# Patient Record
Sex: Female | Born: 1975 | Race: White | Hispanic: No | Marital: Married | Smoking: Never smoker
Health system: Southern US, Community
[De-identification: ages and names within clinical notes are randomized; demographics above are authoritative.]

## PROBLEM LIST (undated history)

## (undated) DIAGNOSIS — B019 Varicella without complication: Secondary | ICD-10-CM

## (undated) DIAGNOSIS — Z803 Family history of malignant neoplasm of breast: Secondary | ICD-10-CM

## (undated) DIAGNOSIS — Z8041 Family history of malignant neoplasm of ovary: Secondary | ICD-10-CM

## (undated) DIAGNOSIS — E559 Vitamin D deficiency, unspecified: Secondary | ICD-10-CM

## (undated) DIAGNOSIS — Z1379 Encounter for other screening for genetic and chromosomal anomalies: Principal | ICD-10-CM

## (undated) HISTORY — DX: Family history of malignant neoplasm of breast: Z80.3

## (undated) HISTORY — DX: Vitamin D deficiency, unspecified: E55.9

## (undated) HISTORY — DX: Encounter for other screening for genetic and chromosomal anomalies: Z13.79

## (undated) HISTORY — DX: Varicella without complication: B01.9

## (undated) HISTORY — DX: Family history of malignant neoplasm of ovary: Z80.41

---

## 2000-12-07 HISTORY — PX: DILATION AND CURETTAGE OF UTERUS: SHX78

## 2000-12-07 HISTORY — PX: WISDOM TOOTH EXTRACTION: SHX21

## 2001-11-15 ENCOUNTER — Encounter (INDEPENDENT_AMBULATORY_CARE_PROVIDER_SITE_OTHER): Payer: Self-pay | Admitting: Specialist

## 2001-11-15 ENCOUNTER — Ambulatory Visit (HOSPITAL_COMMUNITY): Admission: RE | Admit: 2001-11-15 | Discharge: 2001-11-15 | Payer: Self-pay | Admitting: Obstetrics and Gynecology

## 2002-05-08 ENCOUNTER — Other Ambulatory Visit: Admission: RE | Admit: 2002-05-08 | Discharge: 2002-05-08 | Payer: Self-pay | Admitting: Obstetrics and Gynecology

## 2002-05-14 ENCOUNTER — Encounter: Payer: Self-pay | Admitting: *Deleted

## 2002-05-14 ENCOUNTER — Ambulatory Visit (HOSPITAL_COMMUNITY): Admission: RE | Admit: 2002-05-14 | Discharge: 2002-05-14 | Payer: Self-pay | Admitting: *Deleted

## 2002-12-19 ENCOUNTER — Inpatient Hospital Stay (HOSPITAL_COMMUNITY): Admission: AD | Admit: 2002-12-19 | Discharge: 2002-12-23 | Payer: Self-pay | Admitting: Obstetrics and Gynecology

## 2002-12-19 ENCOUNTER — Encounter (INDEPENDENT_AMBULATORY_CARE_PROVIDER_SITE_OTHER): Payer: Self-pay | Admitting: Specialist

## 2003-01-18 ENCOUNTER — Other Ambulatory Visit: Admission: RE | Admit: 2003-01-18 | Discharge: 2003-01-18 | Payer: Self-pay | Admitting: Obstetrics and Gynecology

## 2004-01-23 ENCOUNTER — Other Ambulatory Visit: Admission: RE | Admit: 2004-01-23 | Discharge: 2004-01-23 | Payer: Self-pay | Admitting: Obstetrics and Gynecology

## 2005-04-24 ENCOUNTER — Inpatient Hospital Stay (HOSPITAL_COMMUNITY): Admission: AD | Admit: 2005-04-24 | Discharge: 2005-04-24 | Payer: Self-pay | Admitting: Obstetrics and Gynecology

## 2005-08-19 ENCOUNTER — Inpatient Hospital Stay (HOSPITAL_COMMUNITY): Admission: RE | Admit: 2005-08-19 | Discharge: 2005-08-22 | Payer: Self-pay | Admitting: Obstetrics and Gynecology

## 2009-12-07 HISTORY — PX: GALLBLADDER SURGERY: SHX652

## 2009-12-07 HISTORY — PX: CHOLECYSTECTOMY: SHX55

## 2010-10-11 ENCOUNTER — Emergency Department (HOSPITAL_BASED_OUTPATIENT_CLINIC_OR_DEPARTMENT_OTHER)
Admission: EM | Admit: 2010-10-11 | Discharge: 2010-10-11 | Payer: Self-pay | Source: Home / Self Care | Admitting: Emergency Medicine

## 2010-10-12 ENCOUNTER — Ambulatory Visit: Payer: Self-pay | Admitting: Interventional Radiology

## 2011-01-06 ENCOUNTER — Encounter
Admission: RE | Admit: 2011-01-06 | Discharge: 2011-01-06 | Payer: Self-pay | Source: Home / Self Care | Attending: Obstetrics and Gynecology | Admitting: Obstetrics and Gynecology

## 2011-01-07 ENCOUNTER — Other Ambulatory Visit: Payer: Self-pay | Admitting: Obstetrics and Gynecology

## 2011-01-07 DIAGNOSIS — R928 Other abnormal and inconclusive findings on diagnostic imaging of breast: Secondary | ICD-10-CM

## 2011-01-13 ENCOUNTER — Ambulatory Visit
Admission: RE | Admit: 2011-01-13 | Discharge: 2011-01-13 | Disposition: A | Discharge status: Home / Self Care | Payer: BC Managed Care – PPO | Source: Ambulatory Visit | Attending: Obstetrics and Gynecology | Admitting: Obstetrics and Gynecology

## 2011-01-13 ENCOUNTER — Other Ambulatory Visit: Payer: Self-pay

## 2011-01-13 DIAGNOSIS — R928 Other abnormal and inconclusive findings on diagnostic imaging of breast: Secondary | ICD-10-CM

## 2011-02-17 LAB — URINALYSIS, ROUTINE W REFLEX MICROSCOPIC
Nitrite: POSITIVE — AB
Specific Gravity, Urine: 1.021 (ref 1.005–1.030)
pH: 5.5 (ref 5.0–8.0)

## 2011-02-17 LAB — COMPREHENSIVE METABOLIC PANEL
ALT: 21 U/L (ref 0–35)
AST: 20 U/L (ref 0–37)
Albumin: 4.6 g/dL (ref 3.5–5.2)
CO2: 26 mEq/L (ref 19–32)
Calcium: 9.6 mg/dL (ref 8.4–10.5)
Chloride: 104 mEq/L (ref 96–112)
GFR calc Af Amer: >60 mL/min (ref >60)
GFR calc non Af Amer: >60 mL/min (ref >60)
Sodium: 143 mEq/L (ref 135–145)
Total Bilirubin: 0.5 mg/dL (ref 0.3–1.2)

## 2011-02-17 LAB — URINE CULTURE

## 2011-02-17 LAB — URINE MICROSCOPIC-ADD ON

## 2011-02-17 LAB — DIFFERENTIAL
Eosinophils Absolute: 0 10*3/uL (ref 0.0–0.7)
Eosinophils Relative: 0 % (ref 0–5)
Lymphocytes Relative: 10 % — ABNORMAL LOW (ref 12–46)
Lymphs Abs: 1.6 10*3/uL (ref 0.7–4.0)
Monocytes Absolute: 0.4 10*3/uL (ref 0.1–1.0)

## 2011-02-17 LAB — PREGNANCY, URINE: Preg Test, Ur: NEGATIVE

## 2011-02-17 LAB — CBC
Hemoglobin: 13.7 g/dL (ref 12.0–15.0)
Platelets: 372 10*3/uL (ref 150–400)
RBC: 4.31 MIL/uL (ref 3.87–5.11)

## 2011-02-17 LAB — LIPASE, BLOOD: Lipase: 60 U/L (ref 23–300)

## 2011-04-24 NOTE — Discharge Summary (Signed)
   NAMEBERNARDINA, Kayla [name]                          ACCOUNT NO.:  [id number]   MEDICAL RECORD NO.:  [id number]                   PATIENT TYPE:  INP   LOCATION:  9146                                 FACILITY:  [facility]   PHYSICIAN:  Lenoard Aden, M.D.             DATE OF BIRTH:  09-28-1976   DATE OF ADMISSION:  12/19/2002  DATE OF DISCHARGE:  12/23/2002                                 DISCHARGE SUMMARY   HISTORY OF PRESENT ILLNESS:  The patient underwent uncomplicated primary  cesarean section December 19, 2002 without complications.  Postoperative  course uncomplicated.  Discharged to home postoperative day number four.  Given Tylox, Motrin, prenatal vitamins for discharge.  Discharge teaching  done.  Follow up in the office in four weeks.  Incisional care discussed.                                               Lenoard Aden, M.D.    RJT/MEDQ  D:  02/20/2003  T:  02/21/2003  Job:  [id number]

## 2011-04-24 NOTE — H&P (Signed)
   NAMELAVEYAH, Kayla [name]                          ACCOUNT NO.:  [id number]   MEDICAL RECORD NO.:  [id number]                   PATIENT TYPE:  INP   LOCATION:  9170                                 [facility]ILITY[facility]   PHYSICIAN:  Lenoard Aden, M.D.             DATE OF BIRTH:  Oct 23, 1976   DATE OF ADMISSION:  12/19/2002  DATE OF DISCHARGE:                                HISTORY & PHYSICAL   CHIEF COMPLAINT:  Presumed large for gestational age with single umbilical  artery.   HISTORY OF PRESENT ILLNESS:  A 35 year old white female, G2, P0, EDD of  December 23, 2002, at 39+ weeks with an estimated fetal weight of greater  than 9 pounds and a single umbilical artery, who presents for induction.   PAST MEDICAL HISTORY:  Remarkable for spontaneous pregnancy loss with D&E in  2002.   ALLERGIES:  No known drug allergies.   MEDICATIONS:  Prenatal vitamins.   PAST SURGICAL HISTORY:  1. Wisdom teeth removal.  2. D&E as noted.   FAMILY HISTORY:  Breast cancer, insulin-dependent diabetes, hypertension,  and myocardial infarction.   PRENATAL LABORATORY DATA:  Blood type O+.  Rubella immune.  Hepatitis B  surface antigen negative.  HIV nonreactive.  RPR negative.  GC and chlamydia  negative.  Pregnancy complicated by a normal ultrasound survey with single  umbilical artery and interval growth greater than the 90th percentile.   PHYSICAL EXAMINATION:  GENERAL APPEARANCE:  She is a well-developed, well-  nourished, white female in no apparent distress.  HEENT:  Normal.  LUNGS:  Clear.  HEART:  Regular rhythm.  ABDOMEN:  Soft, gravid, and nontender.  Estimated fetal weight 9 pounds.  PELVIC:  The cervix is 3-4 cm, 50% vertex, and -2.  EXTREMITIES:  No cords.  NEUROLOGIC:  Nonfocal.   IMPRESSION:  1. 39-week pregnancy.  2. Single umbilical artery.  3. Presumed large for gestational age.    PLAN:  Proceed with induction.  The risks of expectant management versus  induction were  discussed.  Placenta to pathology.                                               Lenoard Aden, M.D.    RJT/MEDQ  D:  12/19/2002  T:  12/19/2002  Job:  [id number]   cc:   Ma Hillock OB/BYN

## 2011-04-24 NOTE — H&P (Signed)
Kayla [name], Kayla [name]                ACCOUNT NO.:  [id number]   MEDICAL RECORD NO.:  [id number]          PATIENT TYPE:  INP   LOCATION:  NA                            [facility]   PHYSICIAN:  Lenoard Aden, M.D.DATE OF BIRTH:  27-Jun-1976   DATE OF ADMISSION:  08/19/2005  DATE OF DISCHARGE:                                HISTORY & PHYSICAL   CHIEF COMPLAINT:  Elective repeat C-section.   HISTORY OF PRESENT ILLNESS:  The patient is a 35 year old white female, G3,  P1-0-1-1, EDD August 29, 2005, at 62 weeks' gestation with estimated  fetal weight of greater than 9 pounds for a repeat C-section.  The patient  declines VBAC.   PAST MEDICAL HISTORY:  1.  C-section in 2004.  2.  D&E 2002.   No known drug allergies.   MEDICATIONS:  Prenatal vitamins.   PAST SURGICAL HISTORY:  1.  C-section.  2.  Wisdom teeth removal.  3.  D&E as noted.   FAMILY HISTORY:  Breast cancer, insulin-dependent diabetes, hypertension,  myocardial infarction.   PRENATAL LAB DATA:  Blood type O positive.  Rubella immune.  Hepatitis  surface antigen negative.  HIV nonreactive.  RPR negative.  GC Chlamydia  negative.  Pregnancy otherwise uncomplicated.   PHYSICAL EXAMINATION:  GENERAL:  She is a well-developed, well-nourished,  white female in no acute distress.  HEENT:  Normal.  LUNGS:  Clear.  HEART:  Regular rhythm.  ABDOMEN:  Soft, gravid, nontender.  Estimated fetal weight 9 to 9 and 1/2  pounds.  PELVIC:  Cervix is closed, 3-cm long, vertex, -2.  EXTREMITIES:  Reveal no cords.  NEUROLOGIC:  Nonfocal.   IMPRESSION:  1.  A 39 week intrauterine pregnancy.  2.  Previous cesarean section for repeat.   PLAN:  Proceed with repeat low segment transverse cesarean section.  Risks  of anesthesia, infection, bleeding, intraabdominal rupture and need for  repair is discussed __________  complications to include bowel and bladder  injury were noted with possible need for repair.  The patient  acknowledges  and wishes to proceed.      Lenoard Aden, M.D.  Electronically Signed     RJT/MEDQ  D:  08/18/2005  T:  08/18/2005  Job:  [id number]   cc:   Ma Hillock

## 2011-04-24 NOTE — Op Note (Signed)
[facility]  Patient:    Kayla [name], Kayla [name] Visit Number: [id number] MRN: [id number]          Service Type: DSU Location: [facility] Attending Physician:  Lenoard Aden Dictated by:   Lenoard Aden, M.D. Proc. Date: 11/15/01 Admit Date:  11/15/2001                             Operative Report  PREOPERATIVE DIAGNOSES:       Missed abortion at 9 weeks.  POSTOPERATIVE DIAGNOSES:      Missed abortion at 9 weeks.  PROCEDURE:                    Suction dilatation and evacuation.  SURGEON:                      Lenoard Aden, M.D.  ANESTHESIA:                   MAC, paracervical.  ESTIMATED BLOOD LOSS:         Less than 50 cc.  COMPLICATIONS:                None.  DRAINS:                       None.  COUNTS:                       Correct.  DISPOSITION:                  Patient to recovery in good condition.  PROCEDURE:                    After being apprised of the risks of anesthesia, infection, bleeding, uterine perforation with need for repair, patient was brought to the operating room where she was administered IV sedation without difficulty, prepped and draped in a usual sterile fashion.  Examination under anesthesia reveals an 8 week anteflexed uterus.  No adnexal masses at this time.  Uterus is dilated easily up to a #25 Pratt dilator after placement of a paracervical block with a dilute 2% Xylocaine solution 25 cc.  Cervix is grasped using a single tooth tenaculum and sounds to 12 cm.  Dilated easily as mentioned up to a 25 Pratt dilator and 8 mm curved suction curette placed. Suction revealing products of conception.  Repeat suction and curettage in a four quadrant method reveals the cavity to be empty with good hemostasis achieved.  All instruments are removed.  Examination under anesthesia reveals a small anteflexed uterus.  No adnexal masses.  Patient is awakened and transferred to recovery in good condition. Dictated by:    Lenoard Aden, M.D. Attending Physician:  Lenoard Aden DD:  11/15/01 TD:  11/15/01 Job: [id number]

## 2011-04-24 NOTE — Op Note (Signed)
[name], [name]                ACCOUNT NO.:  [id number]   MEDICAL RECORD NO.:  [id number]          PATIENT TYPE:  INP   LOCATION:  9199                          [facility]ILITY:  [facility]   PHYSICIAN:  Lenoard Aden, M.D.DATE OF BIRTH:  10-24-1976   DATE OF PROCEDURE:  08/19/2005  DATE OF DISCHARGE:                                 OPERATIVE REPORT   PREOPERATIVE DIAGNOSIS:  Thirty-nine week intrauterine pregnancy, for  elective repeat cesarean section.   POSTOPERATIVE DIAGNOSIS:  Thirty-nine week intrauterine pregnancy, for  elective repeat cesarean section.   PROCEDURE:  Repeat low segment transverse cesarean section.   SURGEON:  Lenoard Aden, M.D.   ASSISTANT:  Richardean Sale, M.D.   ESTIMATED BLOOD LOSS:  1000 mL.   COMPLICATIONS:  None.   DRAINS:  Foley.   COUNTS:  Correct.   Patient to recovery in good condition.   ANESTHESIA:  Spinal, Dr. Jean Rosenthal.   BRIEF OPERATIVE NOTE:  After being apprised of the risks of anesthesia,  infection, bleeding, injury to abdominal organs and need for repair, delayed  versus immediate complications to include possible bowel and bladder injury,  patient brought to the operating room, where she was administered a spinal  anesthetic without complications, prepped and draped in the usual sterile  fashion, and a Foley catheter placed.  After achieving adequate anesthesia,  a dilute Marcaine solution placed in the area of the previous Pfannenstiel  skin incision, which was then made with a scalpel, carried down to the  fascia, which was nicked in the midline and opened transversely using Mayo  scissors.  The rectus muscles were separated sharply in the midline,  peritoneum entered sharply.  Filmy anterior abdominal wall adhesions were  known to the entire anterior surface and fundus of the uterus.  Ovaries and  tubes were not visualized.  At this time the bladder flap was developed  sharply off the lower uterine segment and a Kerr  hysterotomy incision made,  clear amniotic fluid noted.  A vacuum-assisted delivery with two pop-offs of  a full-term living female, 9 pounds 13 ounces, delivered atraumatically,  handed to pediatricians in attendance.  Apgars 8 and 9.  Cord blood  collected, placenta delivered manually intact, three-vessel cord noted.  Uterus is curetted using a cry  lap pack and closed in two layers using a 0 Monocryl suture and bladder flap  was inspected and found to be hemostatic.  The fascia then closed using a 0  Monocryl in continuous running fashion, skin closed using staples.  The  patient tolerates the procedure well, transferred to recovery in good  condition.      Lenoard Aden, M.D.  Electronically Signed     RJT/MEDQ  D:  08/19/2005  T:  08/19/2005  Job:  [id number]

## 2011-04-24 NOTE — H&P (Signed)
[facility]  Patient:    Kayla [name], Kayla [name] Visit Number: [id number] MRN: [id number]          Service Type: DSU Location: [facility] Attending Physician:  Lenoard Aden Dictated by:   Lenoard Aden, M.D. Admit Date:  11/15/2001   CC:         [facility]   History and Physical  CHIEF COMPLAINT:              Missed AB.  HISTORY OF PRESENT ILLNESS:   This 35 year old white female, G1, P0, missed AB at six weeks.  MEDICATIONS:                  None.  ALLERGIES:                    None.  PAST SURGICAL HISTORY:        Previous history of oral surgery.  FAMILY HISTORY:               Noncontributory.  SOCIAL HISTORY:               Noncontributory.  PHYSICAL EXAMINATION:  GENERAL:                      Well-developed, well-nourished white female in no apparent distress.  HEENT:                        Normal.  LUNGS:                        Clear.  HEART:                        Regular rate and rhythm.  ABDOMEN:                      Soft, nontender.  PELVIC:                       Uterus small six-week size.  No adnexal masses.  IMPRESSION:                   Missed abortion at six weeks.  PLAN:                         Proceed with suction D&E.  Risks of anesthesia, infection, bleeding, uterine perforation and need for repairs discussed.  The patient acknowledges and desires to proceed. Dictated by:   Lenoard Aden, M.D. Attending Physician:  Lenoard Aden DD:  11/15/01 TD:  11/15/01 Job: [id number]

## 2011-04-24 NOTE — Discharge Summary (Signed)
[name], Kayla [name]                ACCOUNT NO.:  [id number]   MEDICAL RECORD NO.:  [id number]          PATIENT TYPE:  INP   LOCATION:  9102                          [facility]   PHYSICIAN:  Lenoard Aden, M.D.DATE OF BIRTH:  [birth year]   DATE OF ADMISSION:  08/19/2005  DATE OF DISCHARGE:  08/22/2005                                 DISCHARGE SUMMARY   HOSPITAL COURSE:  The patient underwent a complicated repeat low segment  transverse cesarean section on August 19, 2005.  Postoperative course was  uncomplicated.  Discharged home on day #3 tolerating regular diet well at  discharge.  Teaching done.  Discharge medications to include Percocet,  prenatal vitamins and iron.  Follow up in the office in four weeks.      Lenoard Aden, M.D.  Electronically Signed     RJT/MEDQ  D:  10/04/2005  T:  10/05/2005  Job:  [id number]

## 2011-04-24 NOTE — Op Note (Signed)
NAMEDEBRAH, Kayla [name]                          ACCOUNT NO.:  [id number]   MEDICAL RECORD NO.:  [id number]                   PATIENT TYPE:  INP   LOCATION:  9146                                 [facility]ILITY[facility]   PHYSICIAN:  Lenoard Aden, M.D.             DATE OF BIRTH:  04/05/1976   DATE OF PROCEDURE:  12/19/2002  DATE OF DISCHARGE:                                 OPERATIVE REPORT   PREOPERATIVE DIAGNOSIS:  Nonreassuring fetal heart rate, single umbilical  artery, presumed large for gestational age.   POSTOPERATIVE DIAGNOSIS:  Nonreassuring fetal heart rate, single umbilical  artery, presumed large for gestational age.   PROCEDURE:  Urgent left sacrotransverse cesarean section.   SURGEON:  Lenoard Aden, M.D.   ASSISTANT:  None.   ANESTHESIA:  Epidural, Jean Rosenthal.   ESTIMATED BLOOD LOSS:  1000 cc.   DISPOSITION:  Counts correct.  Patient in recovery in good condition.   PLACENTA PATHOLOGY:  Umbilical artery pH 7.3.   FINDINGS:  Full term living female baby, occiput anterior position, Apgars 8  and 9, 9 pounds and 1 ounce.   BRIEF OPERATIVE NOTE:  After being apprised of risks of anesthesia,  infection, bleeding, injury to abdominal organs, need for repair patient was  brought to the operating room.  Anesthesia was administered epidural  anesthetic without complications.  Prepped and draped in usual sterile  fashion.  Foley catheter previously placed.  After achieving adequate  anesthesia, diluted Marcaine solution was placed in the area of  Pfannenstiel.  Skin incision was then made with the scalpel, carried down to  the fascia, nicked in the midline and opened transversely using Mayo  scissors.  Latissimus dissected sharply in the midline, peritoneum entered  sharply and bladder blade was placed.  Visceral peritoneum was scored in a  smile-like fashion.  Uterus was scored in a smile-like fashion.  Atraumatic  delivery from a right occiput-transverse position of a 9  pound, 1 ounce  female, was handed to the pediatrician in attendance after bulb suction.  Apgars 8 and 9.  Cord pH 7.3.  Placenta exteriorized, delivered manually  intact, single umbilical artery noted, sent to pathology for confirmation.  Uterus exteriorized.  Normal tubes and ovaries noted.  Uterus cleared using  a dry laparotomy pack.  The bladder flap was inspected and found to be  hemostatic.  The uterus was closed in two layers using a zero Monocryl  suture in a continuous running fashion with a second embrocating layer was  placed.  After achieving adequate hemostasis, pericolic gutters irrigated,  all blood clots were subsequently  removed.  Bladder flap was reinspected and all packs then removed.  Normal  tubes and ovaries noted.  Fascia then closed using a zero Vicryl in a  continuous running fashion and skin closed using staples.  The patient  tolerated the procedure well and was transferred to recovery in good  condition.  Lenoard Aden, M.D.    RJT/MEDQ  D:  12/19/2002  T:  12/19/2002  Job:  [id number]   cc:   [facility]

## 2011-12-15 ENCOUNTER — Other Ambulatory Visit: Payer: Self-pay | Admitting: Obstetrics and Gynecology

## 2011-12-15 DIAGNOSIS — Z1231 Encounter for screening mammogram for malignant neoplasm of breast: Secondary | ICD-10-CM

## 2012-01-13 ENCOUNTER — Ambulatory Visit
Admission: RE | Admit: 2012-01-13 | Discharge: 2012-01-13 | Disposition: A | Discharge status: Home / Self Care | Payer: BC Managed Care – PPO | Source: Ambulatory Visit | Attending: Obstetrics and Gynecology | Admitting: Obstetrics and Gynecology

## 2012-01-13 DIAGNOSIS — Z1231 Encounter for screening mammogram for malignant neoplasm of breast: Secondary | ICD-10-CM

## 2013-01-02 ENCOUNTER — Other Ambulatory Visit: Payer: Self-pay | Admitting: Obstetrics and Gynecology

## 2013-01-02 DIAGNOSIS — Z1231 Encounter for screening mammogram for malignant neoplasm of breast: Secondary | ICD-10-CM

## 2013-01-25 ENCOUNTER — Ambulatory Visit
Admission: RE | Admit: 2013-01-25 | Discharge: 2013-01-25 | Disposition: A | Discharge status: Home / Self Care | Payer: BC Managed Care – PPO | Source: Ambulatory Visit | Attending: Obstetrics and Gynecology | Admitting: Obstetrics and Gynecology

## 2014-02-21 ENCOUNTER — Other Ambulatory Visit: Payer: Self-pay

## 2014-02-21 DIAGNOSIS — Z1231 Encounter for screening mammogram for malignant neoplasm of breast: Secondary | ICD-10-CM

## 2014-03-08 ENCOUNTER — Ambulatory Visit
Admission: RE | Admit: 2014-03-08 | Discharge: 2014-03-08 | Disposition: A | Discharge status: Home / Self Care | Payer: BC Managed Care – PPO | Source: Ambulatory Visit

## 2014-03-08 DIAGNOSIS — Z1231 Encounter for screening mammogram for malignant neoplasm of breast: Secondary | ICD-10-CM

## 2014-03-09 ENCOUNTER — Other Ambulatory Visit: Payer: Self-pay | Admitting: Obstetrics and Gynecology

## 2014-03-09 DIAGNOSIS — R928 Other abnormal and inconclusive findings on diagnostic imaging of breast: Secondary | ICD-10-CM

## 2014-03-21 ENCOUNTER — Ambulatory Visit
Admission: RE | Admit: 2014-03-21 | Discharge: 2014-03-21 | Disposition: A | Discharge status: Home / Self Care | Payer: BC Managed Care – PPO | Source: Ambulatory Visit | Attending: Obstetrics and Gynecology | Admitting: Obstetrics and Gynecology

## 2014-03-21 DIAGNOSIS — R928 Other abnormal and inconclusive findings on diagnostic imaging of breast: Secondary | ICD-10-CM

## 2016-07-23 LAB — LIPID PANEL
CHOLESTEROL: 174 (ref 0–200)
HDL: 53 (ref 35–70)

## 2016-07-23 LAB — BASIC METABOLIC PANEL: GLUCOSE: 86

## 2016-12-18 DIAGNOSIS — N6311 Unspecified lump in the right breast, upper outer quadrant: Secondary | ICD-10-CM | POA: Diagnosis not present

## 2016-12-21 ENCOUNTER — Other Ambulatory Visit: Payer: Self-pay | Admitting: Obstetrics and Gynecology

## 2016-12-21 DIAGNOSIS — N6001 Solitary cyst of right breast: Secondary | ICD-10-CM

## 2016-12-25 ENCOUNTER — Ambulatory Visit
Admission: RE | Admit: 2016-12-25 | Discharge: 2016-12-25 | Disposition: A | Discharge status: Home / Self Care | Payer: BLUE CROSS/BLUE SHIELD | Source: Ambulatory Visit | Attending: Obstetrics and Gynecology | Admitting: Obstetrics and Gynecology

## 2016-12-25 DIAGNOSIS — N6001 Solitary cyst of right breast: Secondary | ICD-10-CM

## 2016-12-25 DIAGNOSIS — R928 Other abnormal and inconclusive findings on diagnostic imaging of breast: Secondary | ICD-10-CM | POA: Diagnosis not present

## 2016-12-25 DIAGNOSIS — R5383 Other fatigue: Secondary | ICD-10-CM | POA: Diagnosis not present

## 2016-12-25 LAB — BASIC METABOLIC PANEL WITH GFR
BUN: 12 (ref 4–21)
Creatinine: 0.8 (ref 0.5–1.1)
Glucose: 76
Potassium: 4.5 (ref 3.4–5.3)
Sodium: 141 (ref 137–147)

## 2016-12-25 LAB — CBC AND DIFFERENTIAL
HEMOGLOBIN: 14 (ref 12.0–16.0)
PLATELETS: 346 (ref 150–399)
WBC: 10.3

## 2016-12-25 LAB — TSH: TSH: 1.51 (ref 0.41–5.90)

## 2016-12-25 LAB — HEPATIC FUNCTION PANEL
ALT: 15 (ref 7–35)
AST: 17 (ref 13–35)
Alkaline Phosphatase: 93 (ref 25–125)

## 2016-12-25 LAB — VITAMIN B12: VITAMIN B 12: 1142

## 2017-05-12 ENCOUNTER — Telehealth: Payer: Self-pay | Admitting: Behavioral Health

## 2017-05-12 NOTE — Telephone Encounter (Signed)
Unable to reach patient at time of Pre-Visit Call.  Left message for patient to return call when available.    

## 2017-05-12 NOTE — Progress Notes (Addendum)
[place] [facility] [address] [place], [place] [address] [phone]  Date:  05/13/2017   Name:  Kayla [name]   DOB:  October 24, 1976   MRN:  [id number]  PCP:  Darreld Mclean, MD    Chief Complaint: Establish Care (Pt here to est care. c/o changes in menstrual cycle pt does have gyn that she goes to yearly.Need to update tetanus. )   History of Present Illness:  Kayla [name] is a 41 y.o. very pleasant female patient who presents with the following:  Here today as a new patient to establish care  She is not new to this area but just had not had a PCP as she has been in good health  She has 2 sons- born on '04 and '06 She works for [employer]- she has a pretty high level job and does a lot of traveling.  All the traveling can be tiring and lead to less than ideal health habits She does not have much prior health history in general  History of wisdom tooth removal in 2000, she had a MC and D and C in 2002 prior to her 2 live births.   Her first son was born via C/s, she had a repeat with her 2nd son  Gallbladder was removed in 2011  [employer] does labs for her every year so she has regular BP, cholesterol and glucose levels She sees her OBG annually- Dr. Ronita Hipps She will see him on 6/18 for her annual visit Mammogram is UTD Her mother had breast cancer at 61 and 10, 2 different kinds in the same breast   Pt started mammo at age 56  Her main concern is of menorrhagia and possible anemia. This past October her menses were very heavy.  Then in November she felt some pressure in her head- this resolved after a couple of weeks.  She started to wonder if this might be due to anemia  Her cycles have shortened a few days to maybe 24 days instead of 28 days as they were in the past.   She had some labs done at [facility] in January due to concern of anemia  These labs looked ok, but her ferritin was on the low side.    No recent uterine  ultrasound.  She is not aware of any history of fibroids  She does try to exercise some, as her schedule will allow  She is due for a tetanus shot- we will give her a tdap shot today  Reviewed labs from [facility] from January- normal hg/hct, ferritin on the low side of normal She also brings in her labs from work health screening Will abstract into her chart  LMP 5/17  There are no active problems to display for this patient.   Past Medical History:  Diagnosis Date  . Chicken pox     Past Surgical History:  Procedure Laterality Date  . CESAREAN SECTION  2004  . CESAREAN SECTION  2006  . DILATION AND CURETTAGE OF UTERUS  2002  . GALLBLADDER SURGERY  2011  . WISDOM TOOTH EXTRACTION  2002    Social History  Substance Use Topics  . Smoking status: Never Smoker  . Smokeless tobacco: Never Used  . Alcohol use Yes     Comment: occasional     Family History  Problem Relation Age of Onset  . Breast cancer Mother   . Hypertension Father   . Diabetes  Maternal Grandmother   . Colon cancer Cousin   . Stroke Paternal Aunt   . Stroke Paternal Aunt     No Known Allergies  Medication list has been reviewed and updated.  No current outpatient prescriptions on file prior to visit.   No current facility-administered medications on file prior to visit.     Review of Systems:  As per HPI- otherwise negative.   Physical Examination: Vitals:   05/13/17 1630  BP: 132/84  Pulse: 70  Temp: 98.5 F (36.9 C)   Vitals:   05/13/17 1630  Weight: 191 lb 3.2 oz (86.7 kg)  Height: 5\' 4"  (1.626 m)   Body mass index is 32.82 kg/m. Ideal Body Weight: Weight in (lb) to have BMI = 25: 145.3  GEN: WDWN, NAD, Non-toxic, A & O x 3, overweight, looks well HEENT: Atraumatic, Normocephalic. Neck supple. No masses, No LAD.  Bilateral TM wnl, oropharynx normal.  PEERL,EOMI.   Ears and Nose: No external deformity. CV: RRR, No M/G/R. No JVD. No thrill. No extra heart sounds. PULM: CTA B, no  wheezes, crackles, rhonchi. No retractions. No resp. distress. No accessory muscle use. ABD: S, NT, ND, +BS. No rebound. No HSM. EXTR: No c/c/e NEURO Normal gait.  PSYCH: Normally interactive. Conversant. Not depressed or anxious appearing.  Calm demeanor.  Overweight, otherwise looks well  Assessment and Plan: Menorrhagia with regular cycle - Plan: CBC, Ferritin  Immunization due - Plan: Tdap vaccine greater than or equal to 7yo IM  Here today to establish care Her main concern is increased menstrual bleeding and concern about anemia Will repeat her CBC and ferritin today If anemia she will discuss with her OBG later on this month Update tdap today Will plan further follow- up pending labs.  Signed Lamar Blinks, MD  Received her labs 6/10  Results for orders placed or performed in visit on 05/13/17  CBC  Result Value Ref Range   WBC 11.6 (H) 4.0 - 10.5 K/uL   RBC 4.07 3.87 - 5.11 Mil/uL   Platelets 297.0 150.0 - 400.0 K/uL   Hemoglobin 12.7 12.0 - 15.0 g/dL   HCT 38.7 36.0 - 46.0 %   MCV 95.2 78.0 - 100.0 fl   MCHC 32.7 30.0 - 36.0 g/dL   RDW 14.0 11.5 - 15.5 %  Ferritin  Result Value Ref Range   Ferritin 14.9 10.0 - 291.0 ng/mL   Iron low normal, mild leukocytosis.  Would suggest that she have an [place] with her OBG to eval for fibroids and repeat CBC in 3 months to look for resolution of leukocytosis. Will order CBC for her.  Letter to pt she will also need a phone call as her OBG appt is soon.  Will call her as well

## 2017-05-13 ENCOUNTER — Encounter: Payer: Self-pay | Admitting: Family Medicine

## 2017-05-13 ENCOUNTER — Ambulatory Visit (INDEPENDENT_AMBULATORY_CARE_PROVIDER_SITE_OTHER): Payer: BLUE CROSS/BLUE SHIELD | Admitting: Family Medicine

## 2017-05-13 VITALS — BP 132/84 | HR 70 | Temp 98.5°F | Ht 64.0 in | Wt 191.2 lb

## 2017-05-13 DIAGNOSIS — D72829 Elevated white blood cell count, unspecified: Secondary | ICD-10-CM | POA: Diagnosis not present

## 2017-05-13 DIAGNOSIS — Z23 Encounter for immunization: Secondary | ICD-10-CM | POA: Diagnosis not present

## 2017-05-13 DIAGNOSIS — N920 Excessive and frequent menstruation with regular cycle: Secondary | ICD-10-CM | POA: Diagnosis not present

## 2017-05-13 NOTE — Patient Instructions (Addendum)
It was a pleasure to meet you today!  I hope that you have a wonderful summer and get a break from traveling We will be in touch with your labs asap We will look for any evidence of anemia/ low iron which may be causing you to feel tired/ lightheaded. If we do find any evidence of anemia, I would suspect that menstrual bleeding is to blame. Dr. Ronita Hipps can go over options with you

## 2017-05-14 LAB — CBC
HEMATOCRIT: 38.7 % (ref 36.0–46.0)
Hemoglobin: 12.7 g/dL (ref 12.0–15.0)
MCHC: 32.7 g/dL (ref 30.0–36.0)
MCV: 95.2 fl (ref 78.0–100.0)
Platelets: 297 10*3/uL (ref 150.0–400.0)
RBC: 4.07 Mil/uL (ref 3.87–5.11)
RDW: 14 % (ref 11.5–15.5)
WBC: 11.6 10*3/uL — ABNORMAL HIGH (ref 4.0–10.5)

## 2017-05-14 LAB — FERRITIN: FERRITIN: 14.9 ng/mL (ref 10.0–291.0)

## 2017-05-16 NOTE — Addendum Note (Signed)
Addended by: Lamar Blinks on: 05/16/2017 06:33 AM   Modules accepted: Orders

## 2017-05-21 ENCOUNTER — Encounter: Payer: Self-pay | Admitting: Family Medicine

## 2017-05-24 DIAGNOSIS — Z1231 Encounter for screening mammogram for malignant neoplasm of breast: Secondary | ICD-10-CM | POA: Diagnosis not present

## 2017-05-24 DIAGNOSIS — Z01419 Encounter for gynecological examination (general) (routine) without abnormal findings: Secondary | ICD-10-CM | POA: Diagnosis not present

## 2017-05-24 DIAGNOSIS — Z6832 Body mass index (BMI) 32.0-32.9, adult: Secondary | ICD-10-CM | POA: Diagnosis not present

## 2017-08-12 ENCOUNTER — Encounter: Payer: Self-pay | Admitting: Family Medicine

## 2017-08-17 ENCOUNTER — Other Ambulatory Visit (INDEPENDENT_AMBULATORY_CARE_PROVIDER_SITE_OTHER): Payer: BLUE CROSS/BLUE SHIELD

## 2017-08-17 DIAGNOSIS — D72829 Elevated white blood cell count, unspecified: Secondary | ICD-10-CM | POA: Diagnosis not present

## 2017-08-17 LAB — CBC
HEMATOCRIT: 39.5 % (ref 36.0–46.0)
HEMOGLOBIN: 13.4 g/dL (ref 12.0–15.0)
MCHC: 33.9 g/dL (ref 30.0–36.0)
MCV: 96.9 fl (ref 78.0–100.0)
Platelets: 324 10*3/uL (ref 150.0–400.0)
RBC: 4.08 Mil/uL (ref 3.87–5.11)
RDW: 13.4 % (ref 11.5–15.5)
WBC: 6.5 10*3/uL (ref 4.0–10.5)

## 2018-03-22 ENCOUNTER — Encounter: Payer: Self-pay | Admitting: Family Medicine

## 2018-04-04 NOTE — Progress Notes (Addendum)
[place] at [facility] [address], [place], [place] [address] [phone]  Date:  04/06/2018   Name:  Kayla [name]   DOB:  10-05-1976   MRN:  [id number]  PCP:  Darreld Mclean, MD    Chief Complaint: Back Pain (lower back pain, 3-4 months); Nausea; and Urinary Frequency (heavy cycles, family history of ovarian and breast cancer, bloating, abdominal pain)   History of Present Illness:  Kayla [name] is a 42 y.o. very pleasant female patient who presents with the following:  Here today with a concern; She has noted heavier and shorter cycles for the last couple of years.  No hot flashes or vaginal dryness.  She also started to notice a bloated and heavy feeling in her lower abdomen- she thought it was with cycles, but started to notice it was occurring on really a daily basis.   No vomiting or diarrhea, but she may have a stool twice a day now wheras in the past they occurred just once a day She also does notice that she may have pain down her right leg and into her right arm and neck as part of her PMS symptoms   She last saw her GYN last summer for her normal exam-all looked ok  She had a pap and mammo at that time  So far she has not done any genetic testing for gyn cancer genes- see below However at this time she would really like to go ahead and have this testing done Her mother never had testing. Pt does not have daughters, but she does have 2 sisters who also might benefit from any genetic information that she gets   Last seen by myself last June: Here today as a new patient to establish care  She is not new to this area but just had not had a PCP as she has been in good health  She has 2 sons- born on '04 and '06 She works for [employer]- she has a pretty high level job and does a lot of traveling.  All the traveling can be tiring and lead to less than ideal health habits She does not have much prior health history in  general History of wisdom tooth removal in 2000, she had a MC and D and C in 2002 prior to her 2 live births.   Her first son was born via C/s, she had a repeat with her 2nd son Gallbladder was removed in 2011 [employer] does labs for her every year so she has regular BP, cholesterol and glucose levels She sees her OBG annually- Dr. Ronita Hipps She will see him on 6/18 for her annual visit Mammogram is UTD Her mother had breast cancer at 65 and 42, 2 different kinds in the same breast   Pt started mammo at age 65 Her main concern is of menorrhagia and possible anemia. This past October her menses were very heavy.  Then in November she felt some pressure in her head- this resolved after a couple of weeks.  She started to wonder if this might be due to anemia  Her cycles have shortened a few days to maybe 24 days instead of 28 days as they were in the past.   She had some labs done at [facility] in January due to concern of anemia  These labs looked ok, but her ferritin was on the low side.   No recent uterine ultrasound.  She is  not aware of any history of fibroids  After her last visit I had suggested follow up with Dr. Ronita Hipps.    She did do so  She recently sent the following mychart message: Over the last few months, I have developed some symptoms that do not seem to be cycle related. On a daily basis, I have lower abdominal pain and lower back pain. I also have nausea quite a bit. I feel bloated almost all the time and my lower abdomen feels heavy. I am urinating more frequently and recently, I am having bowel movements twice a day, whereas before it was typically once a day or once every other day. I have not had any constipation or diarrhea. I have also experienced quite a bit of indigestion and gas on and off. I have an aunt (mother's sister) who passed away in her 84s from ovarian cancer. My mother had breast cancer at ages 36 and 64. She survived both and is still living. Before I scheduled an  appointment, I wanted to see if the proper place to start was with Dr. Lorelei Pont or if I should go directly to my OB/GYN. My period will be starting in the next few days, so I'm not sure if there would be any advantages to having blood tests during my period  She does not have any dysuria or hematuria,but has noted that she is urinating more frequently   There are no active problems to display for this patient.   Past Medical History:  Diagnosis Date  . Chicken pox     Past Surgical History:  Procedure Laterality Date  . CESAREAN SECTION  2004  . CESAREAN SECTION  2006  . DILATION AND CURETTAGE OF UTERUS  2002  . GALLBLADDER SURGERY  2011  . WISDOM TOOTH EXTRACTION  2002    Social History   Tobacco Use  . Smoking status: Never Smoker  . Smokeless tobacco: Never Used  Substance Use Topics  . Alcohol use: Yes    Comment: occasional   . Drug use: No    Family History  Problem Relation Age of Onset  . Breast cancer Mother   . Hypertension Father   . Diabetes Maternal Grandmother   . Colon cancer Cousin   . Stroke Paternal Aunt   . Stroke Paternal Aunt     No Known Allergies  Medication list has been reviewed and updated.  Current Outpatient Medications on File Prior to Visit  Medication Sig Dispense Refill  . Calcium Carbonate-Vitamin D (CALCIUM-VITAMIN D3 PO) Take 1 tablet by mouth daily.    . Multiple Vitamin (MULTIVITAMIN) tablet Take 1 tablet by mouth daily.     No current facility-administered medications on file prior to visit.     Review of Systems:  As per HPI- otherwise negative. LMP 4/17 She has not noted any weight gain or loss, but does feel like her lower abd/ pelvic area is bigger than in the past Wt Readings from Last 3 Encounters:  04/06/18 195 lb (88.5 kg)  05/13/17 191 lb 3.2 oz (86.7 kg)     Physical Examination: Vitals:   04/06/18 0913  BP: 124/80  Pulse: 83  Resp: 16  SpO2: 98%   Vitals:   04/06/18 0913  Weight: 195 lb (88.5  kg)  Height: 5\' 4"  (1.626 m)   Body mass index is 33.47 kg/m. Ideal Body Weight: Weight in (lb) to have BMI = 25: 145.3  GEN: WDWN, NAD, Non-toxic, A & O x 3, overweight, looks  well  HEENT: Atraumatic, Normocephalic. Neck supple. No masses, No LAD.  Bilateral TM wnl, oropharynx normal.  PEERL,EOMI.   Ears and Nose: No external deformity. CV: RRR, No M/G/R. No JVD. No thrill. No extra heart sounds. PULM: CTA B, no wheezes, crackles, rhonchi. No retractions. No resp. distress. No accessory muscle use. ABD: S, NT, ND. No rebound. No HSM. EXTR: No c/c/e NEURO Normal gait.  PSYCH: Normally interactive. Conversant. Not depressed or anxious appearing.  Calm demeanor.  Pelvic: normal, no vaginal lesions or discharge. Uterus normal, no CMT, no adnexal tendereness or masse  Results for orders placed or performed in visit on 04/06/18  POCT Urinalysis Dipstick (Automated)  Result Value Ref Range   Color, UA Yellow    Clarity, UA Cloudy    Glucose, UA negative    Bilirubin, UA negative    Ketones, UA negative    Spec Grav, UA 1.020 1.010 - 1.025   Blood, UA negative    pH, UA 6.0 5.0 - 8.0   Protein, UA negative    Urobilinogen, UA 0.2 0.2 or 1.0 E.U./dL   Nitrite, UA negative    Leukocytes, UA Negative Negative  POCT urine pregnancy  Result Value Ref Range   Preg Test, Ur Negative Negative     Assessment and Plan: Urinary frequency - Plan: Urine Culture, POCT Urinalysis Dipstick (Automated)  Family history of ovarian cancer - Plan: CA 125, US Pelvis Complete, US Transvaginal Non-OB, Ambulatory referral to Genetics  Abdominal fullness in right lower quadrant - Plan: CBC, Comprehensive metabolic panel, US Pelvis Complete, US Transvaginal Non-OB  Menstrual irregularity - Plan: POCT urine pregnancy, US Pelvis Complete, US Transvaginal Non-OB  Family history of breast cancer - Plan: Ambulatory referral to Genetics  Here today with concern of lower abdominal full feeling, menstrual  changes and really a worry that she might have ovarian cancer. Certainly we hope that this is not the case but I understand her concern.  Will check a Ca-125 for her and also obtain US. Assuming all is well she would like to have genetic counseling which I will arrange for her at the cancer center  Her urine appears negative today, but await culture and will be in touch with this and also BW results asap   Signed Lamar Blinks, MD  Labs so far-  Results for orders placed or performed in visit on 04/06/18  CBC  Result Value Ref Range   WBC 6.9 4.0 - 10.5 K/uL   RBC 4.08 3.87 - 5.11 Mil/uL   Platelets 348.0 150.0 - 400.0 K/uL   Hemoglobin 13.2 12.0 - 15.0 g/dL   HCT 38.8 36.0 - 46.0 %   MCV 95.1 78.0 - 100.0 fl   MCHC 34.0 30.0 - 36.0 g/dL   RDW 13.9 11.5 - 15.5 %  Comprehensive metabolic panel  Result Value Ref Range   Sodium 138 135 - 145 mEq/L   Potassium 4.2 3.5 - 5.1 mEq/L   Chloride 105 96 - 112 mEq/L   CO2 26 19 - 32 mEq/L   Glucose, Bld 110 (H) 70 - 99 mg/dL   BUN 13 6 - 23 mg/dL   Creatinine, Ser 0.81 0.40 - 1.20 mg/dL   Total Bilirubin 0.2 0.2 - 1.2 mg/dL   Alkaline Phosphatase 71 39 - 117 U/L   AST 14 0 - 37 U/L   ALT 12 0 - 35 U/L   Total Protein 7.0 6.0 - 8.3 g/dL   Albumin 4.0 3.5 - 5.2 g/dL  Calcium 9.1 8.4 - 10.5 mg/dL   GFR 81.60 >60.00 mL/min  POCT Urinalysis Dipstick (Automated)  Result Value Ref Range   Color, UA Yellow    Clarity, UA Cloudy    Glucose, UA negative    Bilirubin, UA negative    Ketones, UA negative    Spec Grav, UA 1.020 1.010 - 1.025   Blood, UA negative    pH, UA 6.0 5.0 - 8.0   Protein, UA negative    Urobilinogen, UA 0.2 0.2 or 1.0 E.U./dL   Nitrite, UA negative    Leukocytes, UA Negative Negative  POCT urine pregnancy  Result Value Ref Range   Preg Test, Ur Negative Negative   Message to pt

## 2018-04-06 ENCOUNTER — Encounter: Payer: Self-pay | Admitting: Family Medicine

## 2018-04-06 ENCOUNTER — Encounter: Payer: Self-pay | Admitting: Genetic Counselor

## 2018-04-06 ENCOUNTER — Telehealth: Payer: Self-pay | Admitting: Genetic Counselor

## 2018-04-06 ENCOUNTER — Ambulatory Visit: Payer: BLUE CROSS/BLUE SHIELD | Admitting: Family Medicine

## 2018-04-06 VITALS — BP 124/80 | HR 83 | Resp 16 | Ht 64.0 in | Wt 195.0 lb

## 2018-04-06 DIAGNOSIS — N926 Irregular menstruation, unspecified: Secondary | ICD-10-CM

## 2018-04-06 DIAGNOSIS — R350 Frequency of micturition: Secondary | ICD-10-CM | POA: Diagnosis not present

## 2018-04-06 DIAGNOSIS — Z803 Family history of malignant neoplasm of breast: Secondary | ICD-10-CM | POA: Diagnosis not present

## 2018-04-06 DIAGNOSIS — R198 Other specified symptoms and signs involving the digestive system and abdomen: Secondary | ICD-10-CM

## 2018-04-06 DIAGNOSIS — Z8041 Family history of malignant neoplasm of ovary: Secondary | ICD-10-CM | POA: Diagnosis not present

## 2018-04-06 LAB — COMPREHENSIVE METABOLIC PANEL
ALBUMIN: 4 g/dL (ref 3.5–5.2)
ALK PHOS: 71 U/L (ref 39–117)
ALT: 12 U/L (ref 0–35)
AST: 14 U/L (ref 0–37)
BUN: 13 mg/dL (ref 6–23)
CALCIUM: 9.1 mg/dL (ref 8.4–10.5)
CHLORIDE: 105 meq/L (ref 96–112)
CO2: 26 mEq/L (ref 19–32)
Creatinine, Ser: 0.81 mg/dL (ref 0.40–1.20)
GFR: 81.6 mL/min (ref >60.00)
Glucose, Bld: 110 mg/dL — ABNORMAL HIGH (ref 70–99)
POTASSIUM: 4.2 meq/L (ref 3.5–5.1)
Sodium: 138 mEq/L (ref 135–145)
TOTAL PROTEIN: 7 g/dL (ref 6.0–8.3)
Total Bilirubin: 0.2 mg/dL (ref 0.2–1.2)

## 2018-04-06 LAB — CBC
HCT: 38.8 % (ref 36.0–46.0)
Hemoglobin: 13.2 g/dL (ref 12.0–15.0)
MCHC: 34 g/dL (ref 30.0–36.0)
MCV: 95.1 fl (ref 78.0–100.0)
Platelets: 348 10*3/uL (ref 150.0–400.0)
RBC: 4.08 Mil/uL (ref 3.87–5.11)
RDW: 13.9 % (ref 11.5–15.5)
WBC: 6.9 10*3/uL (ref 4.0–10.5)

## 2018-04-06 LAB — POC URINALSYSI DIPSTICK (AUTOMATED)
BILIRUBIN UA: NEGATIVE
Glucose, UA: NEGATIVE
Ketones, UA: NEGATIVE
Leukocytes, UA: NEGATIVE
NITRITE UA: NEGATIVE
PH UA: 6 (ref 5.0–8.0)
PROTEIN UA: NEGATIVE
RBC UA: NEGATIVE
Spec Grav, UA: 1.02 (ref 1.010–1.025)
UROBILINOGEN UA: 0.2 U/dL

## 2018-04-06 LAB — POCT URINE PREGNANCY: PREG TEST UR: NEGATIVE

## 2018-04-06 NOTE — Patient Instructions (Signed)
Good to see you today- take care and I will be in touch with your labs and we will set up a pelvic US for you asap I ordered a CA-125 test for you; assuming this is negative it will be reassuring that you do not have ovarian cancer

## 2018-04-06 NOTE — Telephone Encounter (Signed)
A genetic counseling appt has been scheduled for the pt to see Ofri on 5/10 at 8am. Pt aware to arrive 15 minutes early. Letter mailed to the pt.

## 2018-04-07 LAB — URINE CULTURE
MICRO NUMBER:: 90531240
RESULT: NO GROWTH
SPECIMEN QUALITY:: ADEQUATE

## 2018-04-08 ENCOUNTER — Ambulatory Visit (HOSPITAL_BASED_OUTPATIENT_CLINIC_OR_DEPARTMENT_OTHER)
Admission: RE | Admit: 2018-04-08 | Discharge: 2018-04-08 | Disposition: A | Discharge status: Home / Self Care | Payer: BLUE CROSS/BLUE SHIELD | Source: Ambulatory Visit | Attending: Family Medicine | Admitting: Family Medicine

## 2018-04-08 ENCOUNTER — Encounter (HOSPITAL_BASED_OUTPATIENT_CLINIC_OR_DEPARTMENT_OTHER): Payer: Self-pay

## 2018-04-08 DIAGNOSIS — N926 Irregular menstruation, unspecified: Secondary | ICD-10-CM | POA: Diagnosis not present

## 2018-04-08 DIAGNOSIS — Z8041 Family history of malignant neoplasm of ovary: Secondary | ICD-10-CM | POA: Diagnosis not present

## 2018-04-08 DIAGNOSIS — D259 Leiomyoma of uterus, unspecified: Secondary | ICD-10-CM | POA: Insufficient documentation

## 2018-04-08 DIAGNOSIS — R198 Other specified symptoms and signs involving the digestive system and abdomen: Secondary | ICD-10-CM | POA: Diagnosis not present

## 2018-04-08 DIAGNOSIS — N83202 Unspecified ovarian cyst, left side: Secondary | ICD-10-CM | POA: Diagnosis not present

## 2018-04-08 LAB — CA 125: CA 125: 35 U/mL — ABNORMAL HIGH (ref <35)

## 2018-04-10 ENCOUNTER — Other Ambulatory Visit: Payer: Self-pay | Admitting: Family Medicine

## 2018-04-10 ENCOUNTER — Encounter: Payer: Self-pay | Admitting: Family Medicine

## 2018-04-10 DIAGNOSIS — R198 Other specified symptoms and signs involving the digestive system and abdomen: Secondary | ICD-10-CM

## 2018-04-10 DIAGNOSIS — Z8041 Family history of malignant neoplasm of ovary: Secondary | ICD-10-CM

## 2018-04-15 ENCOUNTER — Inpatient Hospital Stay: Payer: BLUE CROSS/BLUE SHIELD

## 2018-04-15 ENCOUNTER — Encounter: Payer: Self-pay | Admitting: Genetic Counselor

## 2018-04-15 ENCOUNTER — Inpatient Hospital Stay: Payer: BLUE CROSS/BLUE SHIELD | Attending: Genetic Counselor | Admitting: Genetic Counselor

## 2018-04-15 DIAGNOSIS — Z7183 Encounter for nonprocreative genetic counseling: Secondary | ICD-10-CM | POA: Diagnosis not present

## 2018-04-15 DIAGNOSIS — Z8041 Family history of malignant neoplasm of ovary: Secondary | ICD-10-CM | POA: Insufficient documentation

## 2018-04-15 DIAGNOSIS — Z800 Family history of malignant neoplasm of digestive organs: Secondary | ICD-10-CM | POA: Diagnosis not present

## 2018-04-15 DIAGNOSIS — Z803 Family history of malignant neoplasm of breast: Secondary | ICD-10-CM | POA: Diagnosis not present

## 2018-04-15 NOTE — Progress Notes (Signed)
[facility]      Initial Visit   Patient Name: JOELEE [name] Patient DOB: 25-Aug-1976 Patient Age: 42 y.o. Encounter Date: 04/15/2018  Referring Provider: Darreld Mclean, MD  Primary Care Provider: Darreld Mclean, MD  Reason for Visit: Evaluate for hereditary susceptibility to cancer    Assessment and Plan:  . Kayla [name]'s maternal family history is somewhat suggestive of a hereditary predisposition to cancer given the combination of the breast cancers in her mother and the ovarian cancer in her maternal aunt. However, she has a large family with many unaffected female relatives.   . Testing is recommended to determine whether she has a pathogenic mutation that will impact her screening and risk-reduction for cancer. A negative result will be generally reassuring, but we discussed the importance of testing her mother to determine if she has a mutation, which would be even more reassuring for Kayla [name].  . Kayla [name] wished to pursue genetic testing and a blood sample will be sent for analysis of the 83 genes on [facility]'s Multi-Cancer panel (ALK, APC, ATM, AXIN2, BAP1, BARD1, BLM, BMPR1A, BRCA1, BRCA2, BRIP1, CASR, CDC73, CDH1, CDK4, CDKN1B, CDKN1C, CDKN2A, CEBPA, CHEK2, CTNNA1, DICER1, DIS3L2, EGFR, EPCAM, FH, FLCN, GATA2, GPC3, GREM1, HOXB13, HRAS, KIT, MAX, MEN1, MET, MITF, MLH1, MSH2, MSH3, MSH6, MUTYH, NBN, NF1, NF2, NTHL1, PALB2, PDGFRA, PHOX2B, PMS2, POLD1, POLE, POT1, PRKAR1A, PTCH1, PTEN, RAD50, RAD51C, RAD51D, RB1, RECQL4, RET, RUNX1, SDHA, SDHAF2, SDHB, SDHC, SDHD, SMAD4, SMARCA4, SMARCB1, SMARCE1, STK11, SUFU, TERC, TERT, TMEM127, TP53, TSC1, TSC2, VHL, WRN, WT1).   . Results should be available in approximately 2-4 weeks, at which point we will contact her and address implications for her as well as address genetic testing for at-risk family members, if needed.     Dr. Jana Hakim was available for questions concerning this case. Total  time spent by me in face-to-face counseling was approximately 30 minutes.   _____________________________________________________________________   History of Present Illness: Kayla [name], a 42 y.o. female, is being seen at the [facility] due to a family history of cancer. She presents to clinic today to discuss the possibility of a hereditary predisposition to cancer and discuss whether genetic testing is warranted.  Kayla [name] has no personal history of cancer. She stated that she undergoes a yearly mammogram, clinical breast exam and gynecologic exam.   Past Medical History:  Diagnosis Date  . Chicken pox     Past Surgical History:  Procedure Laterality Date  . CESAREAN SECTION  2004  . CESAREAN SECTION  2006  . DILATION AND CURETTAGE OF UTERUS  2002  . GALLBLADDER SURGERY  2011  . WISDOM TOOTH EXTRACTION  2002    Social History   Socioeconomic History  . Marital status: Married    Spouse name: Not on file  . Number of children: Not on file  . Years of education: Not on file  . Highest education level: Not on file  Occupational History  . Not on file  Social Needs  . Financial resource strain: Not on file  . Food insecurity:    Worry: Not on file    Inability: Not on file  . Transportation needs:    Medical: Not on file    Non-medical: Not on file  Tobacco Use  . Smoking status: Never Smoker  . Smokeless tobacco: Never Used  Substance and Sexual Activity  . Alcohol use: Yes    Comment:  occasional   . Drug use: No  . Sexual activity: Not on file  Lifestyle  . Physical activity:    Days per week: Not on file    Minutes per session: Not on file  . Stress: Not on file  Relationships  . Social connections:    Talks on phone: Not on file    Gets together: Not on file    Attends religious service: Not on file    Active member of club or organization: Not on file    Attends meetings of clubs or organizations: Not on file     Relationship status: Not on file  Other Topics Concern  . Not on file  Social History Narrative  . Not on file     Family History:  During the visit, a 4-generation pedigree was obtained. Family tree will be scanned in the Media tab in Epic  Significant diagnoses include the following:  Family History  Problem Relation Age of Onset  . Breast cancer Mother 6       2nd primary at 72; currently 27  . Hypertension Father   . Diabetes Maternal Grandmother   . Colon cancer Cousin        dx 56s; deceased 1; son of pat aunt with colon ca  . Stroke Paternal Aunt   . Colon cancer Paternal Aunt        dx 5s; currently 70  . Stroke Paternal Aunt   . Breast cancer Paternal Aunt         dx 79s; currently 56  . Ovarian cancer Maternal Aunt 22       deceased 18    Additionally, Kayla [name] has 2 sons (ages 22 and 66). She has 2 sisters (ages 35 and 62). Her mother (noted above) has 3 sisters and 2 brothers. Her father (age 89) has 72 sisters and 2 brothers.  Ms. Werntz ancestry is Caucasian - NOS. There is no known Jewish ancestry and no consanguinity.  Discussion: We reviewed the characteristics, features and inheritance patterns of hereditary cancer syndromes. We discussed her risk of harboring a mutation in the context of her personal and family history and addressed the limitations of negative interpreting results when an unaffected individual undergoes testing. We discussed the process of genetic testing, insurance coverage and implications of results: positive, negative and variant of unknown significance (VUS).    Kayla [name] questions were answered to her satisfaction today and she is welcome to call with any additional questions or concerns. Thank you for the referral and allowing [place] to share in the care of your patient.    Steele Berg, MS, [place] Certified Genetic Counselor phone: [phone] [email or web address]

## 2018-04-22 ENCOUNTER — Encounter: Payer: Self-pay | Admitting: Genetic Counselor

## 2018-04-22 ENCOUNTER — Ambulatory Visit: Payer: Self-pay | Admitting: Genetic Counselor

## 2018-04-22 DIAGNOSIS — Z1379 Encounter for other screening for genetic and chromosomal anomalies: Secondary | ICD-10-CM

## 2018-04-22 HISTORY — DX: Encounter for other screening for genetic and chromosomal anomalies: Z13.79

## 2018-04-22 NOTE — Progress Notes (Signed)
[facility]etics [facility]       Genetic Test Results    Patient Name: Kayla [name] Patient DOB: 1976-05-21 Patient Age: 42 y.o. Encounter Date: 04/22/2018  Referring Provider: Darreld Mclean, MD  Primary Care Provider: Darreld Mclean, MD   Kayla [name] was called today to discuss genetic test results. Please see the Genetics note from Kayla [name] for a detailed discussion of Kayla personal and family history.  Genetic Testing: At the time of Kayla [name]'s visit, she decided to pursue genetic testing of multiple genes associated with hereditary susceptibility to cancer. Testing included sequencing and deletion/duplication analysis. Testing did not reveal a pathogenic mutation in any of the genes analyzed.  A copy of the genetic test report will be scanned into Epic under the Media tab.  The genes analyzed were the 83 genes on [facility]'s Multi-Cancer panel (ALK, APC, ATM, AXIN2, BAP1, BARD1, BLM, BMPR1A, BRCA1, BRCA2, BRIP1, CASR, CDC73, CDH1, CDK4, CDKN1B, CDKN1C, CDKN2A, CEBPA, CHEK2, CTNNA1, DICER1, DIS3L2, EGFR, EPCAM, FH, FLCN, GATA2, GPC3, GREM1, HOXB13, HRAS, KIT, MAX, MEN1, MET, MITF, MLH1, MSH2, MSH3, MSH6, MUTYH, NBN, NF1, NF2, NTHL1, PALB2, PDGFRA, PHOX2B, PMS2, POLD1, POLE, POT1, PRKAR1A, PTCH1, PTEN, RAD50, RAD51C, RAD51D, RB1, RECQL4, RET, RUNX1, SDHA, SDHAF2, SDHB, SDHC, SDHD, SMAD4, SMARCA4, SMARCB1, SMARCE1, STK11, SUFU, TERC, TERT, TMEM127, TP53, TSC1, TSC2, VHL, WRN, WT1).  Since the current test is not perfect, it is possible that there may be a gene mutation that current testing cannot detect, but that chance is small. It is possible that a different genetic factor, which has not yet been discovered or is not on this panel, is responsible for the cancer diagnoses in the family. Again, the likelihood of this is low. No additional testing is recommended at this time for Kayla [name].  A Variant of Uncertain Significance was detected: MSH3  c.3368C>G (p.Thr1123Arg) and TERT c.2014C>T (p.Arg672Cys). This is still considered a normal result. While at this time, it is unknown if this finding is associated with increased cancer risk, the majority of these variants get reclassified to be inconsequential. Medical management should not be based on this finding. With time, we suspect the lab will determine the significance, if any. If we do learn more about it, we will try to contact Kayla [name] to discuss it further. It is important to stay in touch with [place] periodically and keep the address and phone number up to date.  Cancer Screening: These results are reassuring and indicate that Kayla [name] does not likely have an increased risk of cancer due to a mutation in one of these genes. She is recommended to undergo cancer screenings for individuals in the general population. The [employer]  recommends that women follow the breast screening recommendations below, but that these may need to be modified based on other risk factors such as dense breasts, biopsy history or family history.  Breast awareness - Women should be familiar with their breasts and promptly report changes to their healthcare provider.   Starting at age 24: Breast exam, risk assessment, and risk reduction counseling by the provider and mammogram every year. The provider may discuss screening with tomosynthesis.  Kayla [name] is also recommended to undergo a yearly gynecologic exam and initiate colon cancer screening at age 37.  Family Members: Family members are also at some increased risk of developing cancer, over the general population risk, simply due to the  family history. They are recommended to speak with their own providers about appropriate cancer screenings.   Any relative who had cancer at a young age or had a particularly rare cancer may also wish to pursue genetic testing. Genetic counselors can be located in other cities, by visiting the  website of the [employer] ([employer]ist[employer].se) and Field seismologist for a Dietitian by zip code.   Family members are not recommended to get tested for the above VUSs outside of a research protocol as this finding has no implications for their medical management.    Lastly, cancer genetics is a rapidly advancing field and it is possible that new genetic tests will be appropriate for Kayla [name] in the future. Kayla [name] is encouraged to remain in contact with Genetics on an annual basis so we can update Kayla personal and family histories, and let Kayla know of advances in cancer genetics that may benefit the family. Kayla [name] questions were answered to Kayla satisfaction today, and she knows she is welcome to call anytime with additional questions.     Steele Berg, MS, [place] Certified Genetic Counselor phone: [phone]

## 2018-06-07 DIAGNOSIS — Z1151 Encounter for screening for human papillomavirus (HPV): Secondary | ICD-10-CM | POA: Diagnosis not present

## 2018-06-07 DIAGNOSIS — Z6833 Body mass index (BMI) 33.0-33.9, adult: Secondary | ICD-10-CM | POA: Diagnosis not present

## 2018-06-07 DIAGNOSIS — Z01419 Encounter for gynecological examination (general) (routine) without abnormal findings: Secondary | ICD-10-CM | POA: Diagnosis not present

## 2018-06-07 DIAGNOSIS — Z1231 Encounter for screening mammogram for malignant neoplasm of breast: Secondary | ICD-10-CM | POA: Diagnosis not present

## 2019-02-07 ENCOUNTER — Ambulatory Visit: Payer: Self-pay | Admitting: *Deleted

## 2019-02-07 NOTE — Telephone Encounter (Signed)
From: Pecola Leisure  Sent: 02/07/2019  9:07 AM EST  To: Pec Admin Pool  Subject: Appointment scheduled from [place] For: Pecola Leisure ([id number])  Visit Type: MYCHART OFFICE VISIT [phone])    02/09/2019   9:10 AM 20 mins. Copland, Gay Filler, MD  [id number]    Patient Comments:  Pain and numbness in right leg and right arm     Attempted to call all available numbers for patient- left message for patient to call back.

## 2019-02-07 NOTE — Telephone Encounter (Signed)
Patient is reporting that she has had chronic hand,arm and leg numbness and pain that has been coming and going for over 1 year. Patient states she has noticed the symptoms have become more frequent and prominent since January. Patient states she has always thought that her fibroid could be compressing a nerve and causing this- but she has become concerned that since the symptoms are worse- that she could possibly get nerve damage. Patient does have an appointment 3/5- but will send for PCP review to see if she needs to come in sooner. Reason for Disposition . [1] Weakness of arm / hand, or leg / foot AND [2] is a chronic symptom (recurrent or ongoing AND present > 4 weeks)  Answer Assessment - Initial Assessment Questions 1. SYMPTOM: "What is the main symptom you are concerned about?" (e.g., weakness, numbness)     Pain and numbness in arm, leg and toes 2. ONSET: "When did this start?" (minutes, hours, days; while sleeping)     Has been problem- for over 1 year- patient states it is becoming more frequent since January 3. LAST NORMAL: "When was the last time you were normal (no symptoms)?"     Ongoing-over 1 year 4. PATTERN "Does this come and go, or has it been constant since it started?"  "Is it present now?"     Feels like nerve pain and numbness- feels like could be constant- but not as prominent at times- not effecting ability to walk or pick things up  5. CARDIAC SYMPTOMS: "Have you had any of the following symptoms: chest pain, difficulty breathing, palpitations?"     no 6. NEUROLOGIC SYMPTOMS: "Have you had any of the following symptoms: headache, dizziness, vision loss, double vision, changes in speech, unsteady on your feet?"     No- but with sudden movements- she may feel slightly dizzy 7. OTHER SYMPTOMS: "Do you have any other symptoms?"     Cold at times 8. PREGNANCY: "Is there any chance you are pregnant?" "When was your last menstrual period?"     No- LMP- 01/29/19  Protocols  used: NEUROLOGIC DEFICIT-A-AH

## 2019-02-07 NOTE — Progress Notes (Addendum)
[facility] [place], [place], [place] [address] [phone]  Date:  02/09/2019   Name:  Kayla [name]   DOB:  02/17/1976   MRN:  [id number]  PCP:  Darreld Mclean, MD    Chief Complaint: Extremity Pain (right leg pain, right arm pain, numbness, has fibroids/possibly pressing on nerves?)   History of Present Illness:  Kayla [name] is a 43 y.o. very pleasant female patient who presents with the following:  Generally healthy woman here today with concern of leg pain I saw her most recently in the office last May  She works for [employer], has to do a fair amount of traveling for work- went to [place] recently She has 2 sons ages 49 and 26 yo, married to [place]  She did undergo genetic counseling due to family history of breast and ovarian cancer It appears that her genetics results were reassuring  Flu shot- give today  Do routine labs, HIV screen today  She has uterine fibroids managed by per Dr. Ronita Hipps She first noted intermittent right leg pain in the fall of 2017 Since January she is having more persistent leg pain  It sounds like she is having nerve pain-has some burning, aching, urged to shake or move the leg Her toes may feel like they are burning off and on She also gets some burning in her right arm- this is also more frequent since January She is still able to do all her activities, went hiking over the weekend  No neck pain No headaches Her arm and leg do not seem weak, she is not dropping objects or tripping, no falls No genital numbness   She has never had back issues in the past   She does note increasing menstrual bleeding, and her menstrual cycles are shorter. Discussed this with her today, she may want to follow-up with Dr. Genevieve Norlander about treatment options.  No chance of current pregnancy, LMP within the last 7 days  Patient Active Problem List   Diagnosis Date Noted  . Genetic testing  04/22/2018  . Family history of breast cancer   . Family history of ovarian cancer     Past Medical History:  Diagnosis Date  . Chicken pox   . Family history of breast cancer   . Family history of ovarian cancer   . Genetic testing 04/22/2018   Multi-Cancer panel (83 genes) @ [facility] - No pathogenic mutations detected    Past Surgical History:  Procedure Laterality Date  . CESAREAN SECTION  2004  . CESAREAN SECTION  2006  . DILATION AND CURETTAGE OF UTERUS  2002  . GALLBLADDER SURGERY  2011  . WISDOM TOOTH EXTRACTION  2002    Social History   Tobacco Use  . Smoking status: Never Smoker  . Smokeless tobacco: Never Used  Substance Use Topics  . Alcohol use: Yes    Comment: occasional   . Drug use: No    Family History  Problem Relation Age of Onset  . Breast cancer Mother 55       2nd primary at 61; currently 5  . Hypertension Father   . Diabetes Maternal Grandmother   . Colon cancer Cousin        dx 30s; deceased 33; son of pat aunt with colon ca  . Stroke Paternal Aunt   . Colon cancer Paternal Aunt        dx 43s; currently 52  .  Stroke Paternal Aunt   . Breast cancer Paternal Aunt         dx 39s; currently 63  . Ovarian cancer Maternal Aunt 03-26-42       deceased 81    No Known Allergies  Medication list has been reviewed and updated.  Current Outpatient Medications on File Prior to Visit  Medication Sig Dispense Refill  . Calcium Carbonate-Vitamin D (CALCIUM-VITAMIN D3 PO) Take 1 tablet by mouth daily.    . Multiple Vitamin (MULTIVITAMIN) tablet Take 1 tablet by mouth daily.     No current facility-administered medications on file prior to visit.     Review of Systems:  As per HPI- otherwise negative. No fever or chills, no CP or SOB   Physical Examination: Vitals:   02/09/19 0905  BP: 122/80  Pulse: 81  Resp: 16  Temp: 97.7 F (36.5 C)  SpO2: 100%   Vitals:   02/09/19 0905  Weight: 197 lb (89.4 kg)  Height: 5\' 4"  (1.626 m)   Body  mass index is 33.81 kg/m. Ideal Body Weight: Weight in (lb) to have BMI = 25: 145.3  GEN: WDWN, NAD, Non-toxic, A & O x 3, obese, looks well  HEENT: Atraumatic, Normocephalic. Neck supple. No masses, No LAD.  Bilateral TM wnl, oropharynx normal.  PEERL,EOMI.   Ears and Nose: No external deformity. CV: RRR, No M/G/R. No JVD. No thrill. No extra heart sounds. PULM: CTA B, no wheezes, crackles, rhonchi. No retractions. No resp. distress. No accessory muscle use. ABD: S, NT, ND, +BS. No rebound. No HSM. EXTR: No c/c/e NEURO Normal gait.  PSYCH: Normally interactive. Conversant. Not depressed or anxious appearing.  Calm demeanor.  Complete neurological exam is normal today, except for decreased sensation in the right leg.  She has normal strength, sensation, DTR of the upper extremities.  Normal strength and DTR of the lower extremities.  Balance is normal, normal lumbar flexion.  Facial movement and sensation is normal Rapidly alternating movement of hands is normal Feet show strong pulses, normal sensation to monofilament   Assessment and Plan: Menorrhagia with regular cycle - Plan: CBC, Ferritin  Screening for diabetes mellitus - Plan: Comprehensive metabolic panel, Hemoglobin A1c  Screening for hyperlipidemia - Plan: Lipid panel  Screening for HIV (human immunodeficiency virus) - Plan: HIV Antibody (routine testing w rflx)  Numbness and tingling of right leg - Plan: DG Lumbar Spine Complete, predniSONE (DELTASONE) 20 MG tablet  Numbness and tingling of right arm - Plan: DG Cervical Spine Complete, predniSONE (DELTASONE) 20 MG tablet  Need for influenza vaccination - Plan: Flu Vaccine QUAD 6+ mos PF IM (Fluarix Quad PF)  Today with concern of right leg pain, has been present since 03-26-16 but more persistent in the last 3 to 4 months.  No known injury.  She is also more recently started noticing burning in her right arm. She may have a bulging cervical disc, though she does not have any  neck pain. We will obtain plain films of her lumbar and cervical spines. May need to proceed to MRI.  In the meantime, we will have her try a 10-day course of prednisone.  Discussed what to expect while taking prednisone Also labs pain as above Flu shot given  Signed Lamar Blinks, MD Received her labs, message to patient  Results for orders placed or performed in visit on 02/09/19  CBC  Result Value Ref Range   WBC 7.5 4.0 - 10.5 K/uL   RBC 4.10 3.87 -  5.11 Mil/uL   Platelets 352.0 150.0 - 400.0 K/uL   Hemoglobin 13.1 12.0 - 15.0 g/dL   HCT 39.1 36.0 - 46.0 %   MCV 95.3 78.0 - 100.0 fl   MCHC 33.4 30.0 - 36.0 g/dL   RDW 13.8 11.5 - 15.5 %  Comprehensive metabolic panel  Result Value Ref Range   Sodium 137 135 - 145 mEq/L   Potassium 4.5 3.5 - 5.1 mEq/L   Chloride 104 96 - 112 mEq/L   CO2 28 19 - 32 mEq/L   Glucose, Bld 96 70 - 99 mg/dL   BUN 16 6 - 23 mg/dL   Creatinine, Ser 0.85 0.40 - 1.20 mg/dL   Total Bilirubin 0.4 0.2 - 1.2 mg/dL   Alkaline Phosphatase 87 39 - 117 U/L   AST 14 0 - 37 U/L   ALT 14 0 - 35 U/L   Total Protein 6.9 6.0 - 8.3 g/dL   Albumin 4.2 3.5 - 5.2 g/dL   Calcium 9.3 8.4 - 10.5 mg/dL   GFR 72.34 >60.00 mL/min  Hemoglobin A1c  Result Value Ref Range   Hgb A1c MFr Bld 5.3 4.6 - 6.5 %  Lipid panel  Result Value Ref Range   Cholesterol 187 0 - 200 mg/dL   Triglycerides 105.0 0.0 - 149.0 mg/dL   HDL 52.40 >39.00 mg/dL   VLDL 21.0 0.0 - 40.0 mg/dL   LDL Cholesterol 114 (H) 0 - 99 mg/dL   Total CHOL/HDL Ratio 4    NonHDL 134.88   Ferritin  Result Value Ref Range   Ferritin 11.6 10.0 - 291.0 ng/mL    I am still waiting on your x-ray reports.  We will be in touch with these as soon as possible Blood counts are normal, you are not anemic Your hemoglobin appears stable from years past, this is good news Metabolic profile is normal Hemoglobin A1c-average blood sugar over the previous 3 months-he is in normal range. Cholesterol is favorable.  I  have calculated your estimated 10-year risk of cardiovascular disease, see below.  It is very favorable The 10-year ASCVD risk score Mikey Bussing DC Brooke Bonito., et al., 2013) is: 0.7%   Values used to calculate the score:     Age: 29 years     Sex: Female     Is Non-Hispanic African American: No     Diabetic: No     Tobacco smoker: No     Systolic Blood Pressure: 440 mmHg     Is BP treated: No     HDL Cholesterol: 52.4 mg/dL     Total Cholesterol: 187 mg/dL  Your iron-ferritin-is in normal range but at the very bottom of the range.  You might try to increase your dietary iron intake.  Using a cast iron skillet to cook some of your food is a good way to get more iron in your food  Please let me know how the prednisone works for you  Received your x-ray reports as well, see below  Dg Cervical Spine Complete  Result Date: 02/09/2019 CLINICAL DATA:  Right arm tingling for 2 months, no known injury, initial encounter EXAM: CERVICAL SPINE - COMPLETE 4+ VIEW COMPARISON:  None. FINDINGS: Seven cervical segments are well visualized. Vertebral body height is well maintained. No prevertebral soft tissue changes are seen. Neural foramina are widely patent bilaterally. No acute fracture or acute facet abnormality is noted. The odontoid is within normal limits. IMPRESSION: No acute abnormality noted. Electronically Signed   By: Inez Catalina  M.D.   On: 02/09/2019 15:05   Dg Lumbar Spine Complete  Result Date: 02/09/2019 CLINICAL DATA:  Back pain and right leg numbness for 2 months, no known injury, initial encounter EXAM: LUMBAR SPINE - COMPLETE 4+ VIEW COMPARISON:  None. FINDINGS: Five lumbar type vertebral bodies are well visualized. Vertebral body height is well maintained. No pars defects are seen. Mild osteophytic changes are noted. IMPRESSION: Mild degenerative change without acute abnormality. Electronically Signed   By: Inez Catalina M.D.   On: 02/09/2019 15:00   Message to patient

## 2019-02-09 ENCOUNTER — Encounter: Payer: Self-pay | Admitting: Family Medicine

## 2019-02-09 ENCOUNTER — Ambulatory Visit (INDEPENDENT_AMBULATORY_CARE_PROVIDER_SITE_OTHER): Payer: BLUE CROSS/BLUE SHIELD | Admitting: Family Medicine

## 2019-02-09 ENCOUNTER — Ambulatory Visit (HOSPITAL_BASED_OUTPATIENT_CLINIC_OR_DEPARTMENT_OTHER)
Admission: RE | Admit: 2019-02-09 | Discharge: 2019-02-09 | Disposition: A | Discharge status: Home / Self Care | Payer: BLUE CROSS/BLUE SHIELD | Source: Ambulatory Visit | Attending: Family Medicine | Admitting: Family Medicine

## 2019-02-09 VITALS — BP 122/80 | HR 81 | Temp 97.7°F | Resp 16 | Ht 64.0 in | Wt 197.0 lb

## 2019-02-09 DIAGNOSIS — Z13220 Encounter for screening for lipoid disorders: Secondary | ICD-10-CM

## 2019-02-09 DIAGNOSIS — R202 Paresthesia of skin: Secondary | ICD-10-CM

## 2019-02-09 DIAGNOSIS — M47816 Spondylosis without myelopathy or radiculopathy, lumbar region: Secondary | ICD-10-CM | POA: Diagnosis not present

## 2019-02-09 DIAGNOSIS — R200 Anesthesia of skin: Secondary | ICD-10-CM

## 2019-02-09 DIAGNOSIS — Z114 Encounter for screening for human immunodeficiency virus [HIV]: Secondary | ICD-10-CM

## 2019-02-09 DIAGNOSIS — N920 Excessive and frequent menstruation with regular cycle: Secondary | ICD-10-CM

## 2019-02-09 DIAGNOSIS — Z23 Encounter for immunization: Secondary | ICD-10-CM

## 2019-02-09 DIAGNOSIS — Z131 Encounter for screening for diabetes mellitus: Secondary | ICD-10-CM | POA: Diagnosis not present

## 2019-02-09 DIAGNOSIS — Z981 Arthrodesis status: Secondary | ICD-10-CM | POA: Diagnosis not present

## 2019-02-09 LAB — COMPREHENSIVE METABOLIC PANEL
ALT: 14 U/L (ref 0–35)
AST: 14 U/L (ref 0–37)
Albumin: 4.2 g/dL (ref 3.5–5.2)
Alkaline Phosphatase: 87 U/L (ref 39–117)
BUN: 16 mg/dL (ref 6–23)
CO2: 28 mEq/L (ref 19–32)
Calcium: 9.3 mg/dL (ref 8.4–10.5)
Chloride: 104 mEq/L (ref 96–112)
Creatinine, Ser: 0.85 mg/dL (ref 0.40–1.20)
GFR: 72.34 mL/min (ref >60.00)
Glucose, Bld: 96 mg/dL (ref 70–99)
Potassium: 4.5 mEq/L (ref 3.5–5.1)
Sodium: 137 mEq/L (ref 135–145)
Total Bilirubin: 0.4 mg/dL (ref 0.2–1.2)
Total Protein: 6.9 g/dL (ref 6.0–8.3)

## 2019-02-09 LAB — CBC
HCT: 39.1 % (ref 36.0–46.0)
Hemoglobin: 13.1 g/dL (ref 12.0–15.0)
MCHC: 33.4 g/dL (ref 30.0–36.0)
MCV: 95.3 fl (ref 78.0–100.0)
PLATELETS: 352 10*3/uL (ref 150.0–400.0)
RBC: 4.1 Mil/uL (ref 3.87–5.11)
RDW: 13.8 % (ref 11.5–15.5)
WBC: 7.5 10*3/uL (ref 4.0–10.5)

## 2019-02-09 LAB — LIPID PANEL
CHOL/HDL RATIO: 4
Cholesterol: 187 mg/dL (ref 0–200)
HDL: 52.4 mg/dL (ref >39.00)
LDL Cholesterol: 114 mg/dL — ABNORMAL HIGH (ref 0–99)
NonHDL: 134.88
TRIGLYCERIDES: 105 mg/dL (ref 0.0–149.0)
VLDL: 21 mg/dL (ref 0.0–40.0)

## 2019-02-09 LAB — FERRITIN: Ferritin: 11.6 ng/mL (ref 10.0–291.0)

## 2019-02-09 LAB — HEMOGLOBIN A1C: Hgb A1c MFr Bld: 5.3 % (ref 4.6–6.5)

## 2019-02-09 MED ORDER — PREDNISONE 20 MG PO TABS: ORAL_TABLET | ORAL | 0 refills | Status: DC

## 2019-02-09 NOTE — Patient Instructions (Signed)
It was good to see you again today! We will get routine labs, and also look for anemia today After your labs, please go to the ground floor x-ray department.  They will take x-rays of your back and of your neck. Then you can head home I suspect you may have a bulging disc somewhere causing your symptoms.  Let us try a course of prednisone for 10 days. Please let me know if this is or is not helpful.  While you are on prednisone, avoid taking anti-inflammatory such as ibuprofen or Aleve.  Tylenol however is okay  It sounds as though your uterine bleeding is becoming more bothersome.  Dr. Ronita Hipps may have some options, if you would like to follow-up with him

## 2019-02-10 LAB — HIV ANTIBODY (ROUTINE TESTING W REFLEX): HIV 1&2 Ab, 4th Generation: NONREACTIVE

## 2019-02-20 ENCOUNTER — Encounter: Payer: Self-pay | Admitting: Family Medicine

## 2019-02-20 DIAGNOSIS — R200 Anesthesia of skin: Secondary | ICD-10-CM

## 2019-02-20 DIAGNOSIS — R202 Paresthesia of skin: Principal | ICD-10-CM

## 2019-02-21 NOTE — Addendum Note (Signed)
Addended by: Lamar Blinks C on: 02/21/2019 04:01 PM   Modules accepted: Orders

## 2019-02-24 DIAGNOSIS — M5136 Other intervertebral disc degeneration, lumbar region: Secondary | ICD-10-CM | POA: Diagnosis not present

## 2019-02-24 DIAGNOSIS — M5030 Other cervical disc degeneration, unspecified cervical region: Secondary | ICD-10-CM | POA: Diagnosis not present

## 2019-03-15 ENCOUNTER — Encounter: Payer: Self-pay | Admitting: Physical Therapy

## 2019-03-15 ENCOUNTER — Other Ambulatory Visit: Payer: Self-pay

## 2019-03-15 ENCOUNTER — Ambulatory Visit: Payer: BLUE CROSS/BLUE SHIELD | Attending: Physical Medicine and Rehabilitation | Admitting: Physical Therapy

## 2019-03-15 DIAGNOSIS — M542 Cervicalgia: Secondary | ICD-10-CM

## 2019-03-15 DIAGNOSIS — R29818 Other symptoms and signs involving the nervous system: Secondary | ICD-10-CM | POA: Diagnosis not present

## 2019-03-15 DIAGNOSIS — R29898 Other symptoms and signs involving the musculoskeletal system: Secondary | ICD-10-CM

## 2019-03-15 DIAGNOSIS — G8929 Other chronic pain: Secondary | ICD-10-CM | POA: Diagnosis not present

## 2019-03-15 DIAGNOSIS — M5441 Lumbago with sciatica, right side: Secondary | ICD-10-CM | POA: Diagnosis not present

## 2019-03-15 NOTE — Therapy (Signed)
[facility] [address]  [place], [place], [address] Phone: [phone]   Fax:  [phone]  Physical Therapy Evaluation  Patient Details  Name: Kayla [name] MRN: [id number] Date of Birth: Dec 05, 1976 Referring Provider (PT): Suella Broad, MD   Encounter Date: 03/15/2019  PT End of Session - 03/15/19 1541    Visit Number  1    Number of Visits  7    Date for PT Re-Evaluation  04/26/19    Authorization Type  [employer]; VL 30    Authorization - Visit Number  1    Authorization - Number of Visits  30    PT Start Time  4270    PT Stop Time  1402    PT Time Calculation (min)  45 min    Activity Tolerance  Patient tolerated treatment well    Behavior During Therapy  [facility] for tasks assessed/performed       Past Medical History:  Diagnosis Date  . Chicken pox   . Family history of breast cancer   . Family history of ovarian cancer   . Genetic testing 04/22/2018   Multi-Cancer panel (83 genes) @ [facility] - No pathogenic mutations detected    Past Surgical History:  Procedure Laterality Date  . CESAREAN SECTION  2004  . CESAREAN SECTION  2006  . DILATION AND CURETTAGE OF UTERUS  2002  . GALLBLADDER SURGERY  2011  . [place] EXTRACTION  2002    There were no vitals filed for this visit.   Subjective Assessment - 03/15/19 1319    Subjective  Patient reports that her neck pain started in the last couple of weeks, however, R arm numbness from the mid-arm to forearm has been present since January. Pain is located over the lower midline of neck. Unsure of aggravating/easing factors for neck pain, but laying in L sidelying causes N/T in R arm.  Admits to working outside and pulling weeds more in the past few weeks. LBP has been on and off for the past couple of weeks. Reports she has fibroids and LBP is worse around her menstrual cycle.  Numbness in R leg is intermittent but daily has been present since January- worse  with sitting on gardening stool. Also notes burning in the right 3 lateral toes. Denies changes in bowel/bladder control.    Limitations  House hold activities   sleeping   Diagnostic tests  per patient- xray showed mild arthritis in LB, cervical spine clear    Patient Stated Goals  making sure there is no nerve damage    Currently in Pain?  Yes    Pain Score  0-No pain    Pain Location  Back    Pain Orientation  Left;Right;Lower    Pain Descriptors / Indicators  Aching    Pain Type  Chronic pain    Multiple Pain Sites  Yes    Pain Score  0    Pain Location  Neck    Pain Orientation  Mid    Pain Descriptors / Indicators  Aching    Pain Type  Acute pain         [facility] PT Assessment - 03/15/19 1329      Assessment   Medical Diagnosis  Degeneration of cervical intervertebral disc, Degeneration of lumbar intervertebral disc    Referring Provider (PT)  Suella Broad, MD    Onset Date/Surgical Date  --   chronic for several years  Hand Dominance  Right    Next MD Visit  Not scheduled    Prior Therapy  No      Precautions   Precautions  None      Restrictions   Weight Bearing Restrictions  No      Balance Screen   Has the patient fallen in the past 6 months  No    Has the patient had a decrease in activity level because of a fear of falling?   No    Is the patient reluctant to leave their home because of a fear of falling?   No      Home Environment   Living Environment  Private residence    Type of Home  House      Prior Function   Level of Independence  Independent      Cognition   Overall Cognitive Status  Within Functional Limits for tasks assessed      Sensation   Light Touch  Appears Intact      Coordination   Gross Motor Movements are Fluid and Coordinated  Yes      Posture/Postural Control   Posture/Postural Control  Postural limitations    Postural Limitations  Rounded Shoulders      ROM / Strength   AROM / PROM / Strength  AROM;Strength      AROM    AROM Assessment Site  Cervical;Lumbar    Cervical Flexion  45   tightness along midline of spine   Cervical Extension  40    Cervical - Right Side Bend  51   tightness   Cervical - Left Side Bend  47   tightness   Cervical - Right Rotation  WFL    Cervical - Left Rotation  WFL    Lumbar Flexion  toes    Lumbar Extension  WFL    Lumbar - Right Side Bend  distal thigh    Lumbar - Left Side Bend  distal thigh    Lumbar - Right Rotation  mildly limited    Lumbar - Left Rotation  mildly limited   mild midline LBP     Strength   Strength Assessment Site  Shoulder;Hip;Knee;Ankle    Right/Left Shoulder  Right;Left    Right Shoulder Flexion  4+/5    Right Shoulder ABduction  4+/5    Right Shoulder Internal Rotation  4/5    Right Shoulder External Rotation  4+/5    Left Shoulder Flexion  4+/5    Left Shoulder ABduction  4+/5    Left Shoulder Internal Rotation  4/5    Left Shoulder External Rotation  4+/5    Right/Left Hip  Right;Left    Right Hip Flexion  4+/5    Right Hip ABduction  4+/5    Right Hip ADduction  4/5    Left Hip Flexion  4+/5    Left Hip ABduction  4+/5    Left Hip ADduction  4/5    Right/Left Knee  Right;Left    Right Knee Flexion  4/5    Right Knee Extension  4+/5    Left Knee Flexion  4/5    Left Knee Extension  4+/5    Right/Left Ankle  Right;Left    Right Ankle Dorsiflexion  5/5    Right Ankle Plantar Flexion  4+/5    Left Ankle Dorsiflexion  5/5    Left Ankle Plantar Flexion  4+/5      Flexibility   Soft Tissue Assessment /Muscle Length  yes    Hamstrings  B WFL    Quadriceps  B WFL    Piriformis  B WFL      Palpation   Palpation comment  slightly increased tone along B UT with mild TTP in L UT, increased tone along B lumbar paraspinals and TTP in B superior glute      Special Tests    Special Tests  Lumbar    Lumbar Tests  Straight Leg Raise      Straight Leg Raise   Findings  Negative    Side   --   B LEs negative     Ambulation/Gait    Gait Pattern  Within Functional Limits    Ambulation Surface  Level;Indoor                Objective measurements completed on examination: See above findings.              PT Education - 03/15/19 1404    Education Details  prognosis, POC, HEP    Person(s) Educated  Patient    Methods  Explanation;Demonstration;Tactile cues;Verbal cues;Handout    Comprehension  Verbalized understanding;Returned demonstration       PT Short Term Goals - 03/15/19 1551      PT SHORT TERM GOAL #1   Title  Patient to be independent with initial HEP.    Time  3    Period  Weeks    Status  New    Target Date  04/05/19        PT Long Term Goals - 03/15/19 1552      PT LONG TERM GOAL #1   Title  Patient to be independent with advanced HEP.    Time  6    Period  Weeks    Status  New    Target Date  04/26/19      PT LONG TERM GOAL #2   Title  Patient to demonstrate >/= 4/5 strength in B shoulders and LEs.     Time  6    Period  Weeks    Status  New    Target Date  04/26/19      PT LONG TERM GOAL #3   Title  Patient to demonstrate [facility] cervical AROM without complaints.     Time  6    Period  Weeks    Status  New    Target Date  04/26/19      PT LONG TERM GOAL #4   Title  Patient to demonstrate [facility] lumbar AROM without complaints.     Time  6    Period  Weeks    Status  New    Target Date  04/26/19      PT LONG TERM GOAL #5   Title  Patient to report 50% improvement in cervical and lumbar pain.     Time  6    Period  Weeks    Status  New    Target Date  04/26/19             Plan - 03/15/19 1541    Clinical Impression Statement  Patient is a 43y/o F presenting to OPPT with c/o acute neck and chronic LBP with N/T of R UE and R LE since January 2020. Patient with difficulty distinguishing aggravating/easing factors for cervical and lumbar pain, but reporting that L sidelying and sitting on a gardening stool make R UE and L UE N/T worse, respectively. Denies  changes in bowel/bladder.  Patient today with good overall cervical AROM but with c/o tightness, good lumbar AROM with c/o mild midline LBP with L rotation, decreased but symmetrical shoulder IR, hip adduction, and knee flexion strength, slightly increased tone along B UT with mild TTP in L UT, increased tone along B lumbar paraspinals and TTP in B superior glute. Patient educated on gentle ROM and strengthening HEP- reported understanding. Would benefit from skilled PT services 1x/week for 6 weeks to address aforementioned impairments.     Personal Factors and Comorbidities  Time since onset of injury/illness/exacerbation    Examination-Activity Limitations  Sleep;Sit    Examination-Participation Restrictions  Community Activity;Driving;Yard Work    Stability/Clinical Decision Making  Unstable/Unpredictable    Clinical Decision Making  Low    Rehab Potential  Good    PT Frequency  1x / week    PT Duration  6 weeks    PT Treatment/Interventions  ADLs/Self Care Home Management;Cryotherapy;Electrical Stimulation;Functional mobility training;Stair training;Gait training;Ultrasound;Traction;Moist Heat;Therapeutic activities;Therapeutic exercise;Neuromuscular re-education;Patient/family education;Passive range of motion;Manual techniques;Dry needling;Energy conservation;Taping    PT Next Visit Plan  reassess HEP    Consulted and Agree with Plan of Care  Patient       Patient will benefit from skilled therapeutic intervention in order to improve the following deficits and impairments:  Decreased activity tolerance, Decreased strength, Pain, Impaired UE functional use, Increased muscle spasms, Improper body mechanics, Decreased range of motion, Postural dysfunction  Visit Diagnosis: Chronic midline low back pain with right-sided sciatica  Cervicalgia  Other symptoms and signs involving the nervous system  Other symptoms and signs involving the musculoskeletal system     Problem List Patient  Active Problem List   Diagnosis Date Noted  . Genetic testing 04/22/2018  . Family history of breast cancer   . Family history of ovarian cancer      Kayla [name], PT, DPT 03/15/19 3:59 PM   [facility] [address]  [place], [place], [address] Phone: [phone]   Fax:  [phone]  Name: Kayla [name] MRN: [id number] Date of Birth: 01/17/1976

## 2019-03-22 ENCOUNTER — Other Ambulatory Visit: Payer: Self-pay

## 2019-03-22 ENCOUNTER — Ambulatory Visit: Payer: BLUE CROSS/BLUE SHIELD

## 2019-03-22 DIAGNOSIS — M5441 Lumbago with sciatica, right side: Secondary | ICD-10-CM | POA: Diagnosis not present

## 2019-03-22 DIAGNOSIS — G8929 Other chronic pain: Secondary | ICD-10-CM

## 2019-03-22 DIAGNOSIS — M542 Cervicalgia: Secondary | ICD-10-CM

## 2019-03-22 DIAGNOSIS — R29818 Other symptoms and signs involving the nervous system: Secondary | ICD-10-CM

## 2019-03-22 DIAGNOSIS — R29898 Other symptoms and signs involving the musculoskeletal system: Secondary | ICD-10-CM | POA: Diagnosis not present

## 2019-03-22 NOTE — Therapy (Signed)
[facility] [address]  [place], [place], [address] Phone: [phone]   Fax:  [phone]  Physical Therapy Treatment  Patient Details  Name: Kayla [name] MRN: [id number] Date of Birth: [birth year] Referring Provider (PT): Suella Broad, MD   Encounter Date: 03/22/2019  PT End of Session - 03/22/19 1323    Visit Number  2    Number of Visits  7    Date for PT Re-Evaluation  04/26/19    Authorization Type  BCBS; VL 30    Authorization - Visit Number  2    Authorization - Number of Visits  30    PT Start Time  5809    PT Stop Time  1410   ended visit with 10 min moist heat    PT Time Calculation (min)  53 min    Activity Tolerance  Patient tolerated treatment well    Behavior During Therapy  [facility] for tasks assessed/performed       Past Medical History:  Diagnosis Date  . Chicken pox   . Family history of breast cancer   . Family history of ovarian cancer   . Genetic testing 04/22/2018   Multi-Cancer panel (83 genes) @ [facility] - No pathogenic mutations detected    Past Surgical History:  Procedure Laterality Date  . CESAREAN SECTION  2004  . CESAREAN SECTION  2006  . DILATION AND CURETTAGE OF UTERUS  2002  . GALLBLADDER SURGERY  2011  . [place] EXTRACTION  2002    There were no vitals filed for this visit.  Subjective Assessment - 03/22/19 1319    Subjective  Pt. noting she's been feeling, "pretty good" except of her R outer leg feeling, "tight".  Last time she had neck pain was after weeding in yard on Saturday of last week.      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  Yes    Pain Score  2     Pain Location  Leg    Pain Orientation  Right;Lateral;Upper;Lower    Pain Descriptors / Indicators  Tightness    Pain Type  Chronic pain    Pain Radiating Towards   Occasional burning into R lateral toes of R foot    Aggravating Factors   Unsure     Pain Relieving Factors  unsure     Pain Score  0    Pain Location  Neck                       [facility] Adult PT Treatment/Exercise - 03/22/19 1336      Neck Exercises: Seated   Neck Retraction  10 reps;5 secs    Money  10 reps;3 secs    Money Limitations  seated with yellow band     Other Seated Exercise  B shoulder horizontal abduction with yellow TB x 10 reps; 3 " hold       Lumbar Exercises: Aerobic   Nustep  Lvl 5, 6 min (UE/LE)      Lumbar Exercises: Supine   Bridge  10 reps;3 seconds    Bridge Limitations  + isometric hip abd/ER into red TB at [employer] with [employer]  10 reps;3 seconds    Bridge with [employer] Limitations  +  adduction ball squeeze       Lumbar Exercises: Sidelying   Clam  Right;Left;10 reps;3 seconds    Clam Limitations  red looped TB at knees       Modalities   Modalities  Moist Heat      Moist Heat Therapy   Number Minutes Moist Heat  10 Minutes    Moist Heat Location  Lumbar Spine      Neck Exercises: Stretches   Upper Trapezius Stretch  Right;Left;1 rep;30 seconds    Upper Trapezius Stretch Limitations  B     Levator Stretch  Right;Left;1 rep;30 seconds    Levator Stretch Limitations  B     Corner Stretch  2 reps;30 seconds    Corner Stretch Limitations  low doorway              PT Education - 03/22/19 1427    Education Details  HEP update; red looped TB issued to pt. for bridge, yelllow and red TB issued to pt. for retraction activiteies    Person(s) Educated  Patient    Methods  Explanation;Demonstration;Verbal cues;Handout    Comprehension  Verbalized understanding;Returned demonstration;Verbal cues required;Need further instruction       PT Short Term Goals - 03/22/19 1339      PT SHORT TERM GOAL #1   Title  Patient to be independent with initial HEP.    Time  3    Period  Weeks    Status  On-going    Target  Date  04/05/19        PT Long Term Goals - 03/22/19 1339      PT LONG TERM GOAL #1   Title  Patient to be independent with advanced HEP.    Time  6    Period  Weeks    Status  On-going      PT LONG TERM GOAL #2   Title  Patient to demonstrate >/= 4/5 strength in B shoulders and LEs.     Time  6    Period  Weeks    Status  On-going      PT LONG TERM GOAL #3   Title  Patient to demonstrate [facility] cervical AROM without complaints.     Time  6    Period  Weeks    Status  On-going      PT LONG TERM GOAL #4   Title  Patient to demonstrate [facility] lumbar AROM without complaints.     Time  6    Period  Weeks    Status  On-going      PT LONG TERM GOAL #5   Title  Patient to report 50% improvement in cervical and lumbar pain.     Time  6    Period  Weeks    Status  On-going            Plan - 03/22/19 1324    Clinical Impression Statement  Lillan reporting she has been performing HEP daily with some benefit noted.  Only required min cueing for opposite shoulder depression with LS stretch HEP review today and overall good technique verbalizing good knowledge of HEP.  Tolerated progression of lumbopelvic and scapular strengthening activities well today in session.  Did have mild complaint of LBP following chest doorway stretch thus ended session with moist heat applied to lumbar spine to reduce overall tone/pain.      Personal Factors and Comorbidities  Time since onset of injury/illness/exacerbation    Examination-Activity Limitations  Sleep;Sit    Examination-Participation Restrictions  Community Activity;Driving;Yard Work    Stability/Clinical Decision Making  Unstable/Unpredictable    Rehab Potential  Good    PT Treatment/Interventions  ADLs/Self Care Home Management;Cryotherapy;Electrical Stimulation;Functional mobility training;Stair training;Gait training;Ultrasound;Traction;Moist Heat;Therapeutic activities;Therapeutic exercise;Neuromuscular re-education;Patient/family  education;Passive range of motion;Manual techniques;Dry needling;Energy conservation;Taping    PT Next Visit Plan  reassess HEP update     Consulted and Agree with Plan of Care  Patient       Patient will benefit from skilled therapeutic intervention in order to improve the following deficits and impairments:  Decreased activity tolerance, Decreased strength, Pain, Impaired UE functional use, Increased muscle spasms, Improper body mechanics, Decreased range of motion, Postural dysfunction  Visit Diagnosis: Chronic midline low back pain with right-sided sciatica  Cervicalgia  Other symptoms and signs involving the nervous system  Other symptoms and signs involving the musculoskeletal system     Problem List Patient Active Problem List   Diagnosis Date Noted  . Genetic testing 04/22/2018  . Family history of breast cancer   . Family history of ovarian cancer     Bess Harvest, PTA 03/22/19 2:28 PM   [facility] [address]  [place], [place], [address] Phone: [phone]   Fax:  [phone]  Name: CAROLLYNN PENNYWELL MRN: [id number] Date of Birth: 1976-04-22

## 2019-03-29 ENCOUNTER — Encounter: Payer: Self-pay | Admitting: Physical Therapy

## 2019-03-29 ENCOUNTER — Ambulatory Visit: Payer: BLUE CROSS/BLUE SHIELD | Admitting: Physical Therapy

## 2019-03-29 ENCOUNTER — Other Ambulatory Visit: Payer: Self-pay

## 2019-03-29 DIAGNOSIS — G8929 Other chronic pain: Secondary | ICD-10-CM | POA: Diagnosis not present

## 2019-03-29 DIAGNOSIS — M5441 Lumbago with sciatica, right side: Principal | ICD-10-CM

## 2019-03-29 DIAGNOSIS — M542 Cervicalgia: Secondary | ICD-10-CM

## 2019-03-29 DIAGNOSIS — R29818 Other symptoms and signs involving the nervous system: Secondary | ICD-10-CM | POA: Diagnosis not present

## 2019-03-29 DIAGNOSIS — R29898 Other symptoms and signs involving the musculoskeletal system: Secondary | ICD-10-CM | POA: Diagnosis not present

## 2019-03-29 NOTE — Therapy (Signed)
[facility] [address]  [place], [place], [address] Phone: [phone]   Fax:  [phone]  Physical Therapy Treatment  Patient Details  Name: Kayla [name] MRN: [id number] Date of Birth: 01/03/1976 Referring Provider (PT): Suella Broad, MD   Encounter Date: 03/29/2019  PT End of Session - 03/29/19 1407    Visit Number  3    Number of Visits  7    Date for PT Re-Evaluation  04/26/19    Authorization Type  BCBS; VL 30    Authorization - Visit Number  3    Authorization - Number of Visits  30    PT Start Time  3474    PT Stop Time  1358    PT Time Calculation (min)  46 min    Activity Tolerance  Patient tolerated treatment well    Behavior During Therapy  [facility] for tasks assessed/performed       Past Medical History:  Diagnosis Date  . Chicken pox   . Family history of breast cancer   . Family history of ovarian cancer   . Genetic testing 04/22/2018   Multi-Cancer panel (83 genes) @ [facility] - No pathogenic mutations detected    Past Surgical History:  Procedure Laterality Date  . CESAREAN SECTION  2004  . CESAREAN SECTION  2006  . DILATION AND CURETTAGE OF UTERUS  2002  . GALLBLADDER SURGERY  2011  . [place] EXTRACTION  2002    There were no vitals filed for this visit.  Subjective Assessment - 03/29/19 1313    Subjective  Reports she has been feeling pretty good, but has not had changes in N/T or pain.     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  Yes    Pain Score  2     Pain Location  Leg    Pain Orientation  Right;Anterior    Pain Descriptors / Indicators  Tightness    Pain Type  Chronic pain                       [facility] Adult PT Treatment/Exercise - 03/29/19 0001      Exercises   Exercises  Neck      Neck Exercises: Standing   Other  Standing Exercises  B UE row with green TB 2x15   manual cues to avoid R shoulder hiking   Other Standing Exercises  R shoulder IR/ER with yellow TB and dowel under elbow x10 each side   cues to avoid R shoulder hike     Neck Exercises: Seated   Neck Retraction  10 reps;3 secs    Neck Retraction Limitations  2nd set with yellow TB    Money  10 reps;3 secs    Money Limitations  yellow TB; cues form UE positioning    Shoulder ABduction  Both;10 reps    Shoulder Abduction Weights (lbs)  yellow TB    Shoulder Abduction Limitations  cues to avoid shoulder hiking      Lumbar Exercises: Stretches   Hip Flexor Stretch  Right;2 reps;30 seconds    Hip Flexor Stretch Limitations  mod thomas with strap    Piriformis Stretch  Right;2 reps;30 seconds    Piriformis Stretch Limitations  KTOS      Lumbar  Exercises: Aerobic   Nustep  Lvl 5, 6 min (UE/LE)      Lumbar Exercises: Supine   Bridge  10 reps    Bridge Limitations  HS bridge    Bridge with [employer]  15 reps    Bridge with [employer] Limitations  good form    Other Supine Lumbar Exercises  open book stretch x10 each side to tolerance      Neck Exercises: Stretches   Upper Trapezius Stretch  Right;30 seconds;2 reps    Upper Trapezius Stretch Limitations  with strap to tolerance    Levator Stretch  Right;2 reps    Levator Stretch Limitations  with strap to tolerance             PT Education - 03/29/19 1406    Education Details  administered yellow TB for cervical retractions and green TB for row    Person(s) Educated  Patient    Methods  Explanation;Demonstration;Tactile cues;Verbal cues;Handout    Comprehension  Verbalized understanding;Returned demonstration       PT Short Term Goals - 03/29/19 1424      PT SHORT TERM GOAL #1   Title  Patient to be independent with initial HEP.    Time  3    Period  Weeks    Status  Achieved    Target Date  04/05/19        PT Long Term Goals - 03/22/19 1339      PT LONG  TERM GOAL #1   Title  Patient to be independent with advanced HEP.    Time  6    Period  Weeks    Status  On-going      PT LONG TERM GOAL #2   Title  Patient to demonstrate >/= 4/5 strength in B shoulders and LEs.     Time  6    Period  Weeks    Status  On-going      PT LONG TERM GOAL #3   Title  Patient to demonstrate [facility] cervical AROM without complaints.     Time  6    Period  Weeks    Status  On-going      PT LONG TERM GOAL #4   Title  Patient to demonstrate [facility] lumbar AROM without complaints.     Time  6    Period  Weeks    Status  On-going      PT LONG TERM GOAL #5   Title  Patient to report 50% improvement in cervical and lumbar pain.     Time  6    Period  Weeks    Status  On-going            Plan - 03/29/19 1410    Clinical Impression Statement  Patient arrived to session with no new complaints. Good carryover of cervical retractions demonstrated today and patient tolerated addition of light banded resistance. Performed rows with increased banded resistance and with heavy manual and verbal cues to correct R shoulder hiking. Patient with good focus and effort to correct according to cues. Demonstrated good thoracolumbar ROM with open book stretch. Patient denied increase in pain or N/T at end of session. Declined modalities. Plan to address R UT tension with STM next session for hopeful improvement in posture.     PT Treatment/Interventions  ADLs/Self Care Home Management;Cryotherapy;Electrical Stimulation;Functional mobility training;Stair training;Gait training;Ultrasound;Traction;Moist Heat;Therapeutic activities;Therapeutic exercise;Neuromuscular re-education;Patient/family education;Passive range of motion;Manual techniques;Dry needling;Energy conservation;Taping    PT Next Visit  Plan  STM to R UT and focus on postural alignment with periscapular strengthening     Consulted and Agree with Plan of Care  Patient       Patient will benefit from skilled  therapeutic intervention in order to improve the following deficits and impairments:  Decreased activity tolerance, Decreased strength, Pain, Impaired UE functional use, Increased muscle spasms, Improper body mechanics, Decreased range of motion, Postural dysfunction  Visit Diagnosis: Chronic midline low back pain with right-sided sciatica  Cervicalgia  Other symptoms and signs involving the nervous system  Other symptoms and signs involving the musculoskeletal system     Problem List Patient Active Problem List   Diagnosis Date Noted  . Genetic testing 04/22/2018  . Family history of breast cancer   . Family history of ovarian cancer      Kayla [name], PT, DPT 03/29/19 2:26 PM   [facility] [address]  [place], [place], [address] Phone: [phone]   Fax:  [phone]  Name: Kayla [name] MRN: [id number] Date of Birth: [birth year]

## 2019-04-05 ENCOUNTER — Ambulatory Visit: Payer: BLUE CROSS/BLUE SHIELD

## 2019-04-05 ENCOUNTER — Other Ambulatory Visit: Payer: Self-pay

## 2019-04-05 DIAGNOSIS — R29818 Other symptoms and signs involving the nervous system: Secondary | ICD-10-CM | POA: Diagnosis not present

## 2019-04-05 DIAGNOSIS — M5441 Lumbago with sciatica, right side: Principal | ICD-10-CM

## 2019-04-05 DIAGNOSIS — G8929 Other chronic pain: Secondary | ICD-10-CM

## 2019-04-05 DIAGNOSIS — R29898 Other symptoms and signs involving the musculoskeletal system: Secondary | ICD-10-CM

## 2019-04-05 DIAGNOSIS — M542 Cervicalgia: Secondary | ICD-10-CM

## 2019-04-05 NOTE — Therapy (Addendum)
[facility] [address]  [name], [place], [address] Phone: [phone]   Fax:  [phone]  Physical Therapy Treatment  Patient Details  Name: Kayla [name] MRN: [id number] Date of Birth: [birth year] Referring Provider (PT): Kayla Broad, MD   Encounter Date: 04/05/2019  PT End of Session - 04/05/19 1321    Visit Number  4    Number of Visits  7    Date for PT Re-Evaluation  04/26/19    Authorization Type  BCBS; VL 30    Authorization - Visit Number  4    Authorization - Number of Visits  30    PT Start Time  7902    PT Stop Time  1426    PT Time Calculation (min)  71 min    Activity Tolerance  Patient tolerated treatment well    Behavior During Therapy  Kayla [name] for tasks assessed/performed       Past Medical History:  Diagnosis Date  . Chicken pox   . Family history of breast cancer   . Family history of ovarian cancer   . Genetic testing 04/22/2018   Multi-Cancer panel (83 genes) @ [facility] - No pathogenic mutations detected    Past Surgical History:  Procedure Laterality Date  . CESAREAN SECTION  2004  . CESAREAN SECTION  2006  . DILATION AND CURETTAGE OF UTERUS  2002  . GALLBLADDER SURGERY  2011  . [place] EXTRACTION  2002    There were no vitals filed for this visit.  Subjective Assessment - 04/05/19 1318    Subjective  Pt. noting R leg pain is main concern today.  Pt. noting short-lasting dizziness while laying supine in bed in cervical extension position x 5 min on Thursday morning which was relieved with change in position and denies feeling this since Thursday.      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  Yes    Pain Score  5     Pain Location  Leg    Pain Orientation  Right;Anterior;Lateral    Pain Descriptors / Indicators  Aching;Tightness   "sorta of a  tightness"   Pain Type  Chronic pain    Pain Radiating Towards  tightness/ache going down R lateral/anterior          [facility] PT Assessment - 04/05/19 0001      AROM   AROM Assessment Site  Cervical;Lumbar    Cervical Flexion  45    Cervical Extension  60    Cervical - Right Side Bend  51    Cervical - Left Side Bend  50    Cervical - Right Rotation  WFL    Cervical - Left Rotation  WFL    Lumbar Flexion  toes    Lumbar Extension  WFL    Lumbar - Right Side Bend  knee joint     Lumbar - Left Side Bend  knee joint     Lumbar - Right Rotation  WFL    Lumbar - Left Rotation  [name]      Strength   Right/Left Shoulder  Right;Left    Right Shoulder Flexion  4+/5    Right Shoulder ABduction  4+/5    Right Shoulder Internal Rotation  4+/5    Right Shoulder External Rotation  4+/5  Left Shoulder Flexion  4+/5    Left Shoulder ABduction  4+/5    Left Shoulder Internal Rotation  4+/5    Left Shoulder External Rotation  4+/5                   [facility] Adult PT Treatment/Exercise - 04/05/19 1329      Self-Care   Self-Care  Other Self-Care Comments    Other Self-Care Comments   Ball self-glute massage on wall       Neck Exercises: Theraband   Shoulder External Rotation  15 reps;Red;Green   2 sets; 2nd set with green TB    Shoulder External Rotation Limitations  Hooklying on pool noodle + red TB       Lumbar Exercises: Stretches   Single Knee to Chest Stretch  Right;Left;1 rep;30 seconds    Single Knee to Chest Stretch Limitations  B    Lower Trunk Rotation  5 reps;10 seconds    Lower Trunk Rotation Limitations  B     Hip Flexor Stretch  Right;30 seconds;1 rep    Hip Flexor Stretch Limitations  mod thomas with strap    Piriformis Stretch  Right;2 reps;30 seconds    Piriformis Stretch Limitations  KTOS      Lumbar Exercises: Aerobic   Nustep  Lvl 5, 7 min (UE/LE)      Lumbar Exercises: Standing   Other Standing Lumbar Exercises  B side stepping with red TB at  ankles 2 x 30 ft       Lumbar Exercises: Supine   Bridge with [employer]  15 reps    Bridge with [employer] Limitations  HS bridge + ball adduction squeeze       Lumbar Exercises: Sidelying   Other Sidelying Lumbar Exercises  B sidelying "open book"  stretch x 10 reps       Manual Therapy   Manual Therapy  Soft tissue mobilization;Passive ROM    Soft tissue mobilization  STM to R UT - only mild ttp in mid R UT    Passive ROM  cervical ROM with prolonged holds all directions; manual R piri, HS, SKTC strethc with therapist x 20 sec each way       Neck Exercises: Stretches   Corner Stretch  1 rep;60 seconds    Corner Stretch Limitations  Hooklying on pool noodle                PT Short Term Goals - 03/29/19 1424      PT SHORT TERM GOAL #1   Title  Patient to be independent with initial HEP.    Time  3    Period  Weeks    Status  Achieved    Target Date  04/05/19        PT Long Term Goals - 04/05/19 1441      PT LONG TERM GOAL #1   Title  Patient to be independent with advanced HEP.    Time  6    Period  Weeks    Status  Partially Met      PT LONG TERM GOAL #2   Title  Patient to demonstrate >/= 4/5 strength in B shoulders and LEs.     Time  6    Period  Weeks    Status  Partially Met   04/05/19: met for B shoulders      PT LONG TERM GOAL #3   Title  Patient to demonstrate [name] cervical AROM  without complaints.     Time  6    Period  Weeks    Status  Achieved      PT LONG TERM GOAL #4   Title  Patient to demonstrate [facility] lumbar AROM without complaints.     Time  6    Period  Weeks    Status  Achieved      PT LONG TERM GOAL #5   Title  Patient to report 50% improvement in cervical and lumbar pain.     Time  6    Period  Weeks    Status  Partially Met   no near complete resolution of cervical pain however still with complaint of lumbar/R buttocks pain            Plan - 04/05/19 1323    Clinical Impression Statement  [name]'s primary complaint  today was R LE ache/tightness pain sensation which remained unchanged throughout LE stretching, proximal hip strengthening activities.  [name] was able to demo cervical and lumbar AROM WFL today achieving LTG #3.  Able to partially achieve LTG #2 achieving shoulder strength goal with MMT now 4+/5 across all shoulder muscle groups.  HEP updated to target remaining weaknesses.  Will continue to progress toward goals.      Personal Factors and Comorbidities  Time since onset of injury/illness/exacerbation    Examination-Activity Limitations  Sleep;Sit    Examination-Participation Restrictions  Community Activity;Driving;Yard Work    Rehab Potential  Good    PT Treatment/Interventions  ADLs/Self Care Home Management;Cryotherapy;Electrical Stimulation;Functional mobility training;Stair training;Gait training;Ultrasound;Traction;Moist Heat;Therapeutic activities;Therapeutic exercise;Neuromuscular re-education;Patient/family education;Passive range of motion;Manual techniques;Dry needling;Energy conservation;Taping    PT Next Visit Plan  Monitor medial R buttocks pain on transfers; monitor R LE pain/tightness; postural strengtheing, periscapular strengthening; modalities prn     Consulted and Agree with Plan of Care  Patient       Patient will benefit from skilled therapeutic intervention in order to improve the following deficits and impairments:  Decreased activity tolerance, Decreased strength, Pain, Impaired UE functional use, Increased muscle spasms, Improper body mechanics, Decreased range of motion, Postural dysfunction  Visit Diagnosis: Chronic midline low back pain with right-sided sciatica  Cervicalgia  Other symptoms and signs involving the nervous system  Other symptoms and signs involving the musculoskeletal system     Problem List Patient Active Problem List   Diagnosis Date Noted  . Genetic testing 04/22/2018  . Family history of breast cancer   . Family history of ovarian cancer      Bess Harvest, PTA 04/05/19 2:51 PM   [name] [address]  [place], [place], [address] Phone: [phone]   Fax:  [phone]  Name: Kayla [name] MRN: [id number] Date of Birth: 11-Dec-1975

## 2019-04-12 ENCOUNTER — Ambulatory Visit: Payer: BLUE CROSS/BLUE SHIELD | Attending: Physical Medicine and Rehabilitation

## 2019-04-12 ENCOUNTER — Other Ambulatory Visit: Payer: Self-pay

## 2019-04-12 DIAGNOSIS — R29818 Other symptoms and signs involving the nervous system: Secondary | ICD-10-CM | POA: Diagnosis not present

## 2019-04-12 DIAGNOSIS — R29898 Other symptoms and signs involving the musculoskeletal system: Secondary | ICD-10-CM

## 2019-04-12 DIAGNOSIS — G8929 Other chronic pain: Secondary | ICD-10-CM

## 2019-04-12 DIAGNOSIS — M5441 Lumbago with sciatica, right side: Secondary | ICD-10-CM | POA: Insufficient documentation

## 2019-04-12 DIAGNOSIS — M542 Cervicalgia: Secondary | ICD-10-CM | POA: Diagnosis not present

## 2019-04-12 NOTE — Therapy (Signed)
[facility] [address]  [place], [place], [address] Phone: [phone]   Fax:  [phone]  Physical Therapy Treatment  Patient Details  Name: Kayla [name] MRN: [id number] Date of Birth: 27-May-1976 Referring Provider (PT): Suella Broad, MD   Encounter Date: 04/12/2019  PT End of Session - 04/12/19 1320    Visit Number  5    Number of Visits  7    Date for PT Re-Evaluation  04/26/19    Authorization Type  BCBS; VL 30    Authorization - Visit Number  5    Authorization - Number of Visits  30    PT Start Time  4818    PT Stop Time  1410    PT Time Calculation (min)  56 min    Activity Tolerance  Patient tolerated treatment well    Behavior During Therapy  [facility] for tasks assessed/performed       Past Medical History:  Diagnosis Date  . Chicken pox   . Family history of breast cancer   . Family history of ovarian cancer   . Genetic testing 04/22/2018   Multi-Cancer panel (83 genes) @ [facility] - No pathogenic mutations detected    Past Surgical History:  Procedure Laterality Date  . CESAREAN SECTION  2004  . CESAREAN SECTION  2006  . DILATION AND CURETTAGE OF UTERUS  2002  . GALLBLADDER SURGERY  2011  . [place] EXTRACTION  2002    There were no vitals filed for this visit.  Subjective Assessment - 04/12/19 1316    Subjective  Pt. reporting she went hiking, biking, and walking two days over weekend and feels R LE pain/numbness somewhat better this week.      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  Yes    Pain Score  3    up to 3/10 at times    Pain Location  Leg    Pain Orientation  Right;Anterior    Pain Descriptors / Indicators  Aching;Tightness    Pain Type  Chronic pain    Pain Radiating Towards  tightness/ache going down R lateral/anterior thigh, only  occasional burning into R toes    Pain Onset  More than a month ago    Pain Frequency  Intermittent    Aggravating Factors   Unsure     Pain Relieving Factors  Unsure    Multiple Pain Sites  No                       [facility] Adult PT Treatment/Exercise - 04/12/19 0001      Self-Care   Self-Care  Other Self-Care Comments    Other Self-Care Comments   Reviewed HEP to check for full understanding and need for update with band resistance       Lumbar Exercises: Stretches   Lumbar Stabilization Level 1  2 reps;20 seconds    Lumbar Stabilization Level 1 Limitations  prone prayer stretch R and middle     Lumbar Stabilization Level 2  3 reps;20 seconds    Lumbar Stabilization Level 1 Limitations  seated green p-ball rollouts; R, L, middle     Piriformis Stretch  Right;30 seconds;3 reps    Piriformis Stretch Limitations  KTOS    Figure 4 Stretch  2 reps;30 seconds;Supine    Figure 4 Stretch Limitations  B      Lumbar Exercises: Aerobic   Nustep  Lvl 5, 6 min (UE/LE)      Lumbar Exercises: Standing   Functional Squats  15 reps;3 seconds    Functional Squats Limitations  green TB at knees       Lumbar Exercises: Supine   Clam  10 reps;3 seconds    Clam Limitations  green TB       Lumbar Exercises: Sidelying   Clam  Right;Left;10 reps;3 seconds    Clam Limitations  green looped TB at knees       Lumbar Exercises: Prone   Straight Leg Raise  10 reps;3 seconds    Straight Leg Raises Limitations  pain free    Other Prone Lumbar Exercises  Prone press ups 3" x 10 reps - full painfree ROM       Lumbar Exercises: Quadruped   Madcat/Old Horse  10 reps   good ROM    Madcat/Old Horse Limitations  3" hold              PT Education - 04/12/19 1414    Education Details  HEP update; green TB issued to pt. for bridge and sidelying clam shell     Person(s) Educated  Patient    Methods  Explanation;Demonstration;Verbal cues;Handout    Comprehension  Verbalized  understanding;Returned demonstration;Verbal cues required;Need further instruction       PT Short Term Goals - 03/29/19 1424      PT SHORT TERM GOAL #1   Title  Patient to be independent with initial HEP.    Time  3    Period  Weeks    Status  Achieved    Target Date  04/05/19        PT Long Term Goals - 04/12/19 1405      PT LONG TERM GOAL #1   Title  Patient to be independent with advanced HEP.    Time  6    Period  Weeks    Status  Partially Met      PT LONG TERM GOAL #2   Title  Patient to demonstrate >/= 4/5 strength in B shoulders and LEs.     Time  6    Period  Weeks    Status  Partially Met   04/05/19: met for B shoulders      PT LONG TERM GOAL #3   Title  Patient to demonstrate [facility] cervical AROM without complaints.     Time  6    Period  Weeks    Status  Achieved      PT LONG TERM GOAL #4   Title  Patient to demonstrate [facility] lumbar AROM without complaints.     Time  6    Period  Weeks    Status  Achieved      PT LONG TERM GOAL #5   Title  Patient to report 50% improvement in cervical and lumbar pain.     Time  6    Period  Weeks    Status  Partially Met   04/12/19: noted near complete resolution of cervical pain however still with complaint of lumbar/R buttocks pain            Plan - 04/12/19 1326    Clinical Impression Statement  Pt. reporting ~ 10% improvement in R LE pain/numbness since starting therapy.  Pt. reporting that she is still feeling R  LE pain/slight numbness in R anterior/lateral thigh occasionally throughout day without known trigger.  Pt. reporting she was able hike, bike, and walk over weekend without feeling limited by pain.  Tolerated progression of proximal hip strengthening activities well today and able to progress band resistance with supine and sidelying clam shell activities.  Progressing well toward goals at this point.      Personal Factors and Comorbidities  Time since onset of injury/illness/exacerbation    Rehab Potential   Good    PT Treatment/Interventions  ADLs/Self Care Home Management;Cryotherapy;Electrical Stimulation;Functional mobility training;Stair training;Gait training;Ultrasound;Traction;Moist Heat;Therapeutic activities;Therapeutic exercise;Neuromuscular re-education;Patient/family education;Passive range of motion;Manual techniques;Dry needling;Energy conservation;Taping    PT Next Visit Plan  Monitor R LE pain/tightness; progress LE strengthening activities focusing on hip extension, ER/IR strengthening    Consulted and Agree with Plan of Care  Patient       Patient will benefit from skilled therapeutic intervention in order to improve the following deficits and impairments:  Decreased activity tolerance, Decreased strength, Pain, Impaired UE functional use, Increased muscle spasms, Improper body mechanics, Decreased range of motion, Postural dysfunction  Visit Diagnosis: Chronic midline low back pain with right-sided sciatica  Cervicalgia  Other symptoms and signs involving the nervous system  Other symptoms and signs involving the musculoskeletal system     Problem List Patient Active Problem List   Diagnosis Date Noted  . Genetic testing 04/22/2018  . Family history of breast cancer   . Family history of ovarian cancer    Kayla [name], PTA 04/12/19 2:25 PM   [facility] [address]  [place], [place], [address] Phone: [phone]   Fax:  [phone]  Name: Kayla [name] MRN: [id number] Date of Birth: 1976/04/20

## 2019-04-19 ENCOUNTER — Other Ambulatory Visit: Payer: Self-pay

## 2019-04-19 ENCOUNTER — Encounter: Payer: Self-pay | Admitting: Physical Therapy

## 2019-04-19 ENCOUNTER — Ambulatory Visit: Payer: BLUE CROSS/BLUE SHIELD | Admitting: Physical Therapy

## 2019-04-19 DIAGNOSIS — R29818 Other symptoms and signs involving the nervous system: Secondary | ICD-10-CM | POA: Diagnosis not present

## 2019-04-19 DIAGNOSIS — M542 Cervicalgia: Secondary | ICD-10-CM

## 2019-04-19 DIAGNOSIS — R29898 Other symptoms and signs involving the musculoskeletal system: Secondary | ICD-10-CM

## 2019-04-19 DIAGNOSIS — M5441 Lumbago with sciatica, right side: Secondary | ICD-10-CM

## 2019-04-19 DIAGNOSIS — G8929 Other chronic pain: Secondary | ICD-10-CM

## 2019-04-19 NOTE — Therapy (Addendum)
[facility] [address]  [place], [place], [address] Phone: [phone]   Fax:  [phone]  Physical Therapy Treatment  Patient Details  Name: Kayla [name] MRN: [id number] Date of Birth: 1976-10-17 Referring Provider (PT): Suella Broad, MD   Encounter Date: 04/19/2019  PT End of Session - 04/19/19 1406    Visit Number  6    Number of Visits  7    Date for PT Re-Evaluation  04/26/19    Authorization Type  BCBS; VL 30    Authorization - Visit Number  6    Authorization - Number of Visits  30    PT Start Time  2542    PT Stop Time  1404    PT Time Calculation (min)  48 min    Activity Tolerance  Patient tolerated treatment well    Behavior During Therapy  [facility] for tasks assessed/performed       Past Medical History:  Diagnosis Date  . Chicken pox   . Family history of breast cancer   . Family history of ovarian cancer   . Genetic testing 04/22/2018   Multi-Cancer panel (83 genes) @ [facility] - No pathogenic mutations detected    Past Surgical History:  Procedure Laterality Date  . CESAREAN SECTION  2004  . CESAREAN SECTION  2006  . DILATION AND CURETTAGE OF UTERUS  2002  . GALLBLADDER SURGERY  2011  . [place] EXTRACTION  2002    There were no vitals filed for this visit.  Subjective Assessment - 04/19/19 1317    Subjective  Reports that she hasn't had as much N/T or pain in her leg. Notes no activity changes that could have contributed to improvement in pain levels. Would like to transition to HEP at this time.    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  No/denies         [facility] PT Assessment - 04/19/19 0001      Strength   Right Shoulder Flexion  5/5    Right Shoulder ABduction  5/5    Right Shoulder Internal Rotation  4+/5    Right Shoulder External Rotation   4+/5    Left Shoulder Flexion  5/5    Left Shoulder ABduction  5/5    Left Shoulder Internal Rotation  4+/5    Left Shoulder External Rotation  4+/5    Right Hip Flexion  5/5    Right Hip ABduction  4+/5    Right Hip ADduction  4/5    Left Hip Flexion  5/5    Left Hip ABduction  4+/5    Left Hip ADduction  4/5    Right Knee Flexion  5/5    Right Knee Extension  5/5    Left Knee Flexion  4+/5    Left Knee Extension  5/5    Right Ankle Dorsiflexion  5/5    Right Ankle Plantar Flexion  5/5    Left Ankle Dorsiflexion  5/5                   [facility] Adult PT Treatment/Exercise - 04/19/19 0001      Exercises   Exercises  Knee/Hip      Neck Exercises: Standing   Other Standing Exercises  B UE row with blue  TB x15   improvement in shoulder posturing   Other Standing Exercises  R shoulder IR/ER with red TB and dowel under elbow x10 each side      Lumbar Exercises: Aerobic   Nustep  Lvl 5, 6 min LEs only      Lumbar Exercises: Supine   Bridge  10 reps    Bridge Limitations  2x10; cues for core contraction    Bridge with [employer]  15 reps    Bridge with [employer] Limitations  with firmer green ball      Knee/Hip Exercises: Standing   Other Standing Knee Exercises  side stepping with red TB around toes 2x69f    Other Standing Knee Exercises  hip adduction towel slides at treadmill rail x10 each side   cues to maintain knees straight     Knee/Hip Exercises: Sidelying   Hip ADduction  Strengthening;Right;Left;1 set;15 reps    Hip ADduction Limitations  cues for alignment              PT Education - 04/19/19 1406    Education Details  update and consolidation of HEP; discussion on objective progress with PT    Person(s) Educated  Patient    Methods  Explanation;Demonstration;Tactile cues;Verbal cues;Handout    Comprehension  Verbalized understanding;Returned demonstration       PT Short Term Goals - 04/19/19 1320      PT SHORT TERM GOAL #1   Title   Patient to be independent with initial HEP.    Time  3    Period  Weeks    Status  Achieved    Target Date  04/05/19        PT Long Term Goals - 04/19/19 1320      PT LONG TERM GOAL #1   Title  Patient to be independent with advanced HEP.    Time  6    Period  Weeks    Status  Achieved      PT LONG TERM GOAL #2   Title  Patient to demonstrate >/= 4/5 strength in B shoulders and LEs.     Time  6    Period  Weeks    Status  Achieved   04/19/19: met for all muscle groups     PT LONG TERM GOAL #3   Title  [facility] to [facility] complaints.     Time  6    Period  Weeks    Status  Achieved      PT LONG TERM GOAL #4   Title  Patient to demonstrate [facility].     Time  6    Period  Weeks    Status  Achieved      PT LONG TERM GOAL #5   Title  Patient to report 50% improvement in cervical and lumbar pain.     Time  6    Period  Weeks    Status  Achieved   04/19/19: reports 70% improvement in pain levels at this time           Plan - 04/19/19 1414    Clinical Impression Statement  Patient arrived to session with report of much improved cervical and lumbar pain for the past 2 weeks. Notes that N/T has also improved. Patient has demonstrated excellent improvements in UE and LE strength since starting PT- now with B shoulder IR/ER and hip adduction most limiting factors. Patient has also met cervical and  lumbar AROM goals. Also reporting 70% improvement in pain levels, achieving all goals at this time. Worked on progressive hip and periscapular strengthening exercises to address remaining strength deficits. Patient tolerated all exercises with no complaints. Updated and reviewed HEP with progressive strengthening exercises that were well-tolerated today. Patient has met all goals at this time and will be placed on 30 day hold. Patient in agreement.     PT Treatment/Interventions  ADLs/Self Care Home  Management;Cryotherapy;Electrical Stimulation;Functional mobility training;Stair training;Gait training;Ultrasound;Traction;Moist Heat;Therapeutic activities;Therapeutic exercise;Neuromuscular re-education;Patient/family education;Passive range of motion;Manual techniques;Dry needling;Energy conservation;Taping    PT Next Visit Plan  30 day hold at this time    Consulted and Agree with Plan of Care  Patient       Patient will benefit from skilled therapeutic intervention in order to improve the following deficits and impairments:  Decreased activity tolerance, Decreased strength, Pain, Impaired UE functional use, Increased muscle spasms, Improper body mechanics, Decreased range of motion, Postural dysfunction  Visit Diagnosis: Chronic midline low back pain with right-sided sciatica  Cervicalgia  Other symptoms and signs involving the nervous system  Other symptoms and signs involving the musculoskeletal system     Problem List Patient Active Problem List   Diagnosis Date Noted  . Genetic testing 04/22/2018  . Family history of breast cancer   . Family history of ovarian cancer     Janene Harvey, PT, DPT 04/19/19 2:18 PM    [facility] [address]  [place], [place], [address] Phone: [phone]   Fax:  [phone]  Name: Kayla [name] MRN: [id number] Date of Birth: 10-27-1976   PHYSICAL THERAPY DISCHARGE SUMMARY  Visits from Start of Care: 6  Current functional level related to goals / functional outcomes: See above clinical summary   Remaining deficits: None   Education / Equipment: HEP  Plan: Patient agrees to discharge.  Patient goals were met. Patient is being discharged due to meeting the stated rehab goals.  ?????     Janene Harvey, PT, DPT 05/29/19 11:28 AM

## 2019-06-26 IMAGING — DX DG CERVICAL SPINE COMPLETE 4+V
6 series · 6 of 6 positions shown · non-contrast
Comparison: None.

CLINICAL DATA: Right arm tingling for 2 months, no known injury,
initial encounter

EXAM:
CERVICAL SPINE - COMPLETE 4+ VIEW

[c-spine lat]
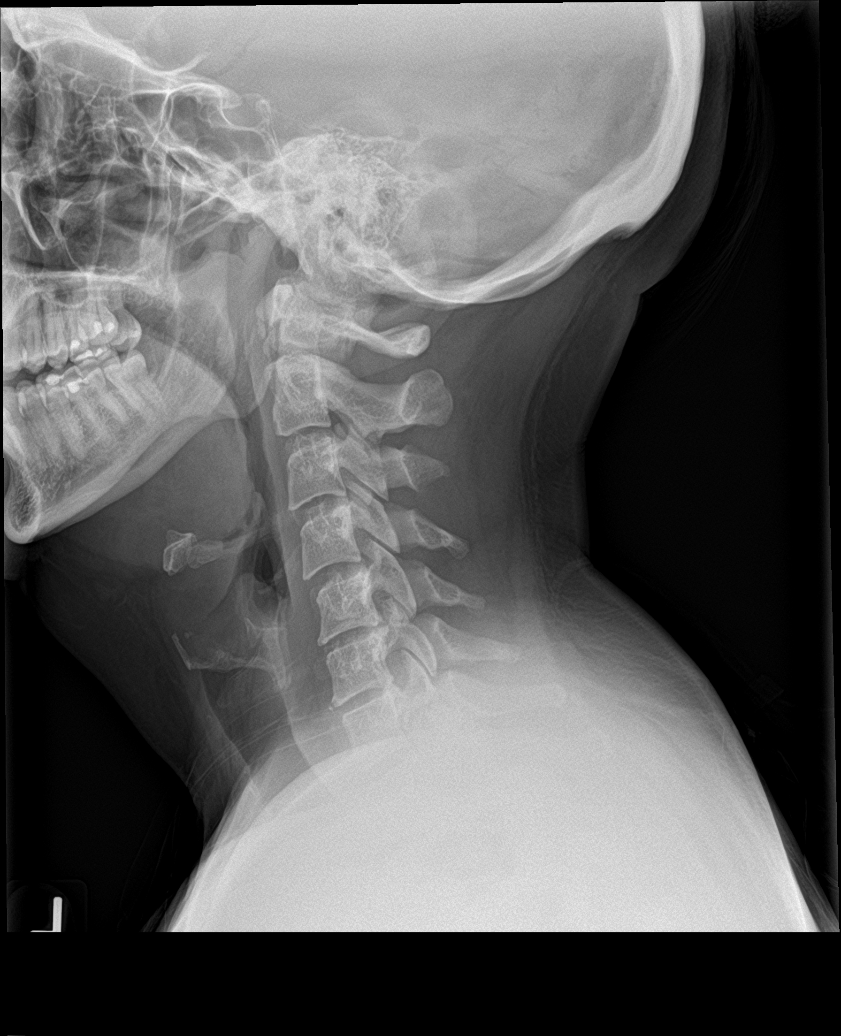

[c-spine obl (1 of 2)]
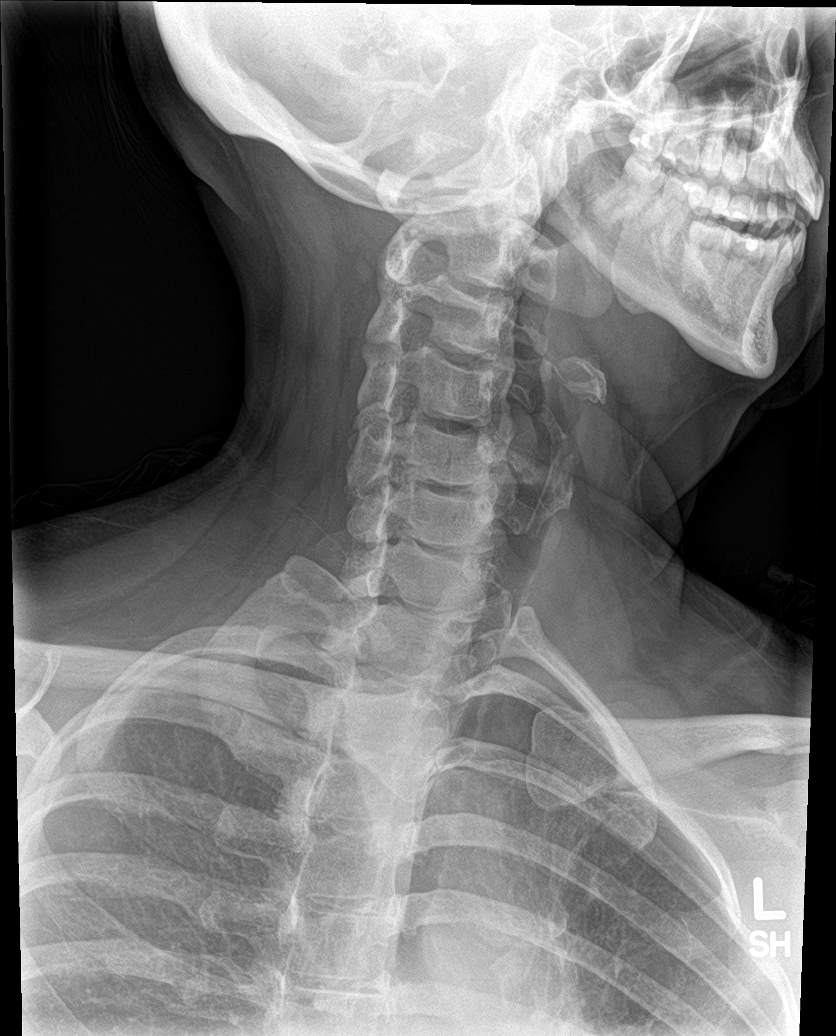

[c-spine obl (2 of 2)]
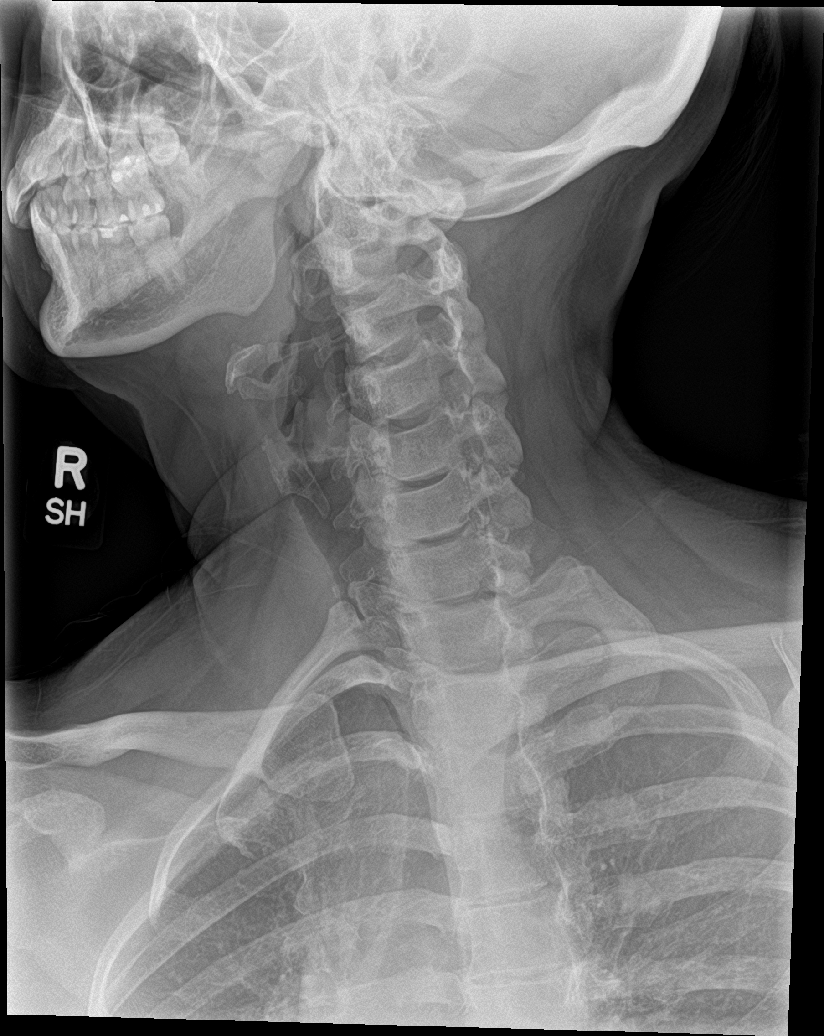

[c-spine ap]
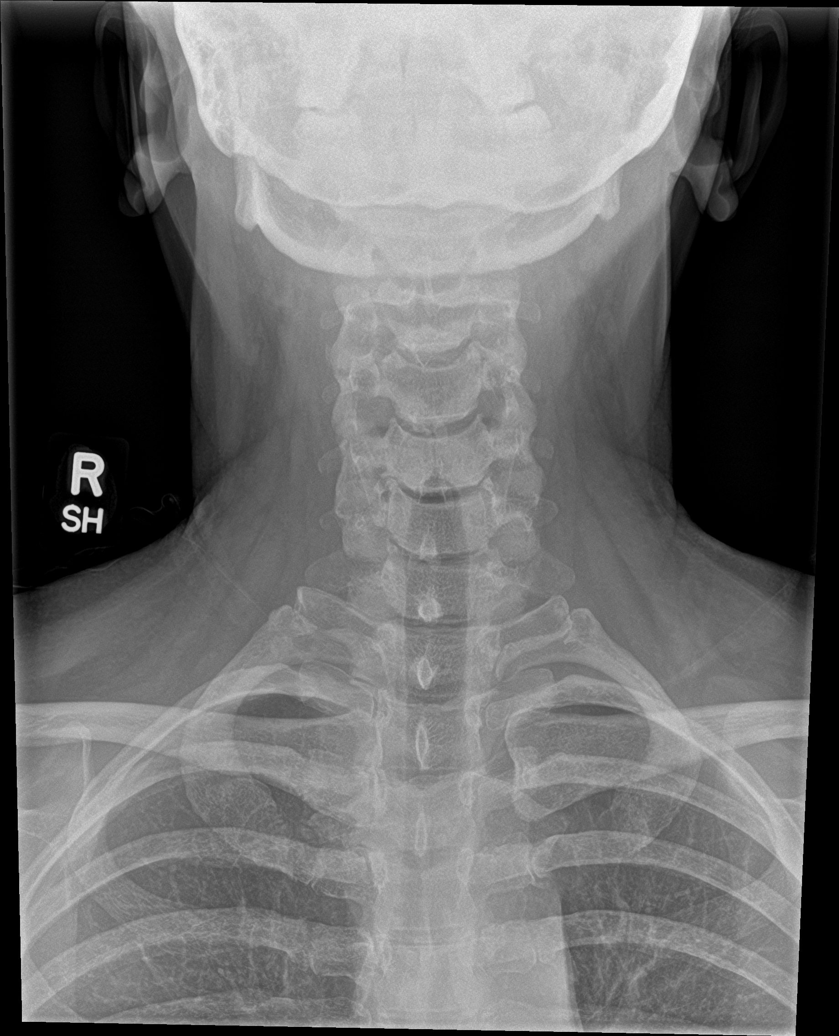

[c-spine open mouth]
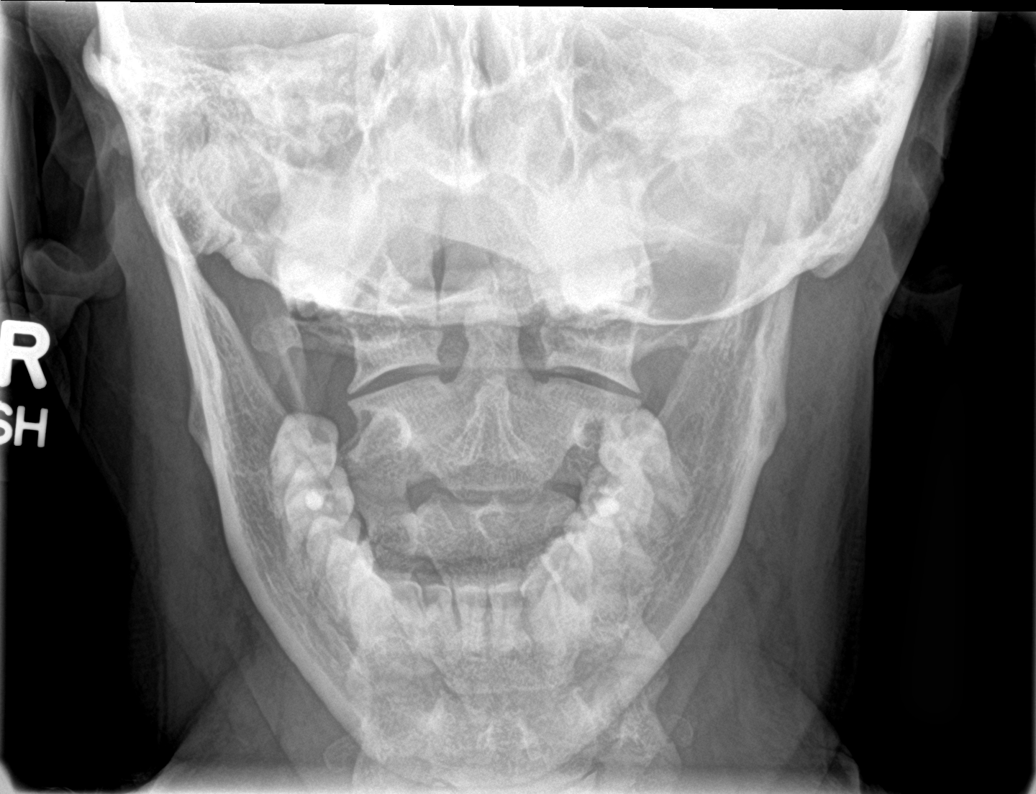

[c-spine swimmers]
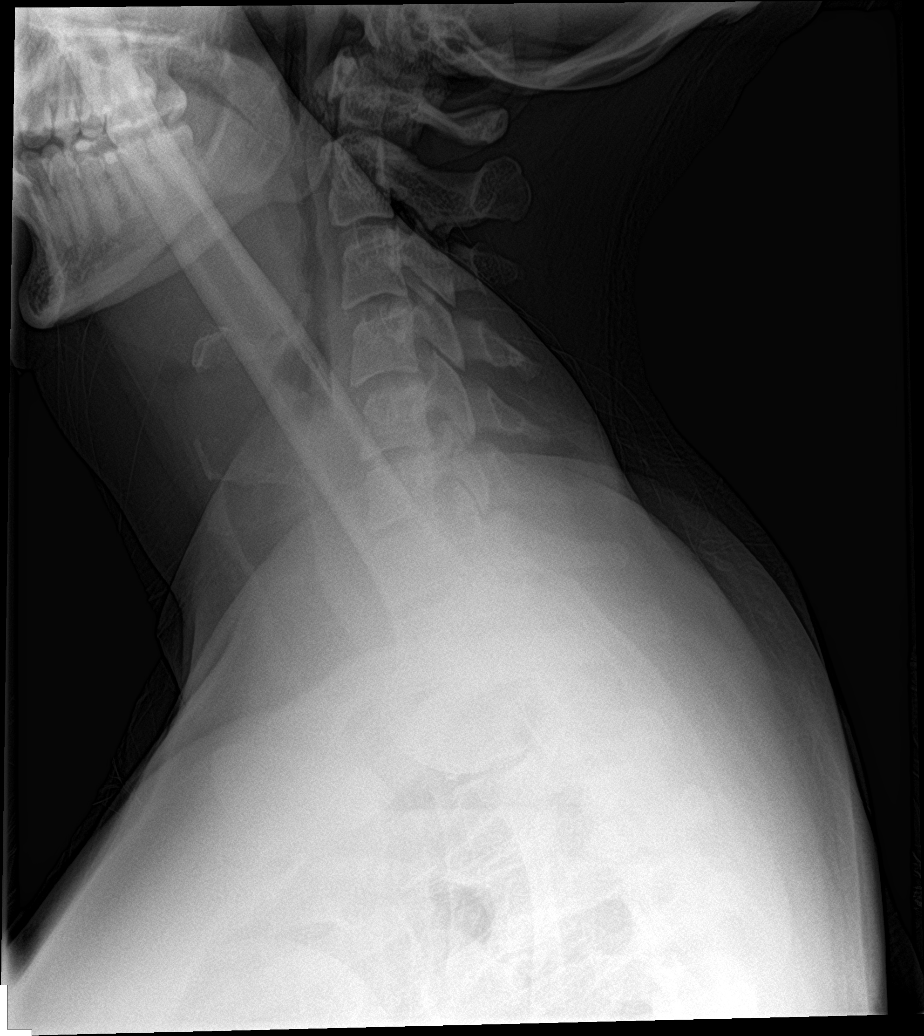

[6 of 6 positions shown; findings below may reference images not displayed]

FINDINGS: Seven cervical segments are well visualized. Vertebral body height
is well maintained. No prevertebral soft tissue changes are seen.
Neural foramina are widely patent bilaterally. No acute fracture or
acute facet abnormality is noted. The odontoid is within normal
limits.
IMPRESSION: No acute abnormality noted.

## 2019-08-04 DIAGNOSIS — Z1231 Encounter for screening mammogram for malignant neoplasm of breast: Secondary | ICD-10-CM | POA: Diagnosis not present

## 2019-08-04 DIAGNOSIS — Z01419 Encounter for gynecological examination (general) (routine) without abnormal findings: Secondary | ICD-10-CM | POA: Diagnosis not present

## 2019-08-04 DIAGNOSIS — Z6833 Body mass index (BMI) 33.0-33.9, adult: Secondary | ICD-10-CM | POA: Diagnosis not present

## 2019-08-08 ENCOUNTER — Other Ambulatory Visit: Payer: Self-pay | Admitting: Obstetrics and Gynecology

## 2019-08-08 DIAGNOSIS — N6489 Other specified disorders of breast: Secondary | ICD-10-CM

## 2019-08-09 ENCOUNTER — Telehealth: Payer: Self-pay | Admitting: Family Medicine

## 2019-08-09 NOTE — Telephone Encounter (Signed)
Recv'd records from [facility] forwarded 1 page to Dr. Janett Billow Copland  9/2/20fbg

## 2019-08-17 ENCOUNTER — Other Ambulatory Visit: Payer: Self-pay

## 2019-08-17 ENCOUNTER — Ambulatory Visit: Payer: BC Managed Care – PPO

## 2019-08-17 ENCOUNTER — Ambulatory Visit
Admission: RE | Admit: 2019-08-17 | Discharge: 2019-08-17 | Disposition: A | Discharge status: Home / Self Care | Payer: BC Managed Care – PPO | Source: Ambulatory Visit | Attending: Obstetrics and Gynecology | Admitting: Obstetrics and Gynecology

## 2019-08-17 DIAGNOSIS — R922 Inconclusive mammogram: Secondary | ICD-10-CM | POA: Diagnosis not present

## 2019-08-17 DIAGNOSIS — N6489 Other specified disorders of breast: Secondary | ICD-10-CM

## 2020-08-03 ENCOUNTER — Ambulatory Visit: Payer: Self-pay | Attending: Internal Medicine

## 2020-08-03 DIAGNOSIS — Z23 Encounter for immunization: Secondary | ICD-10-CM

## 2020-08-03 NOTE — Progress Notes (Signed)
° °  Covid-19 Vaccination Clinic  Name:  Kayla [name]    MRN: [id number] DOB: 1976/10/29  08/03/2020  Kayla [name] was observed post Covid-19 immunization for 15 minutes without incident. She was provided with Vaccine Information Sheet and instruction to access the V-Safe system.   Kayla [name] was instructed to call 911 with any severe reactions post vaccine:  Difficulty breathing   Swelling of face and throat   A fast heartbeat   A bad rash all over body   Dizziness and weakness   Immunizations Administered    Name Date Dose VIS Date Route   Pfizer COVID-19 Vaccine 08/03/2020  9:53 AM 0.3 mL 01/31/2019 Intramuscular   Manufacturer: [place]   Lot: [place]: [id number]

## 2020-08-05 DIAGNOSIS — Z1231 Encounter for screening mammogram for malignant neoplasm of breast: Secondary | ICD-10-CM | POA: Diagnosis not present

## 2020-08-05 LAB — HM MAMMOGRAPHY

## 2020-08-06 ENCOUNTER — Telehealth: Payer: Self-pay | Admitting: Family Medicine

## 2020-08-06 NOTE — Telephone Encounter (Signed)
Recv'd records from [facility] forwarded 1 page to Dr. Janett Billow Copland 8/31/21fbg

## 2020-08-14 ENCOUNTER — Encounter: Payer: Self-pay | Admitting: Family Medicine

## 2020-08-26 ENCOUNTER — Ambulatory Visit: Payer: BC Managed Care – PPO | Attending: Internal Medicine

## 2020-08-26 DIAGNOSIS — Z23 Encounter for immunization: Secondary | ICD-10-CM

## 2020-08-26 NOTE — Progress Notes (Signed)
   Covid-19 Vaccination Clinic  Name:  Kayla [name]    MRN: [id number] DOB: 05-19-1976  08/26/2020  [name] was observed post Covid-19 immunization for 15 minutes without incident. She was provided with Vaccine Information Sheet and instruction to access the V-Safe system.  Vaccinated by [facility]  [name] was instructed to call 911 with any severe reactions post vaccine: Marland Kitchen Difficulty breathing  . Swelling of face and throat  . A fast heartbeat  . A bad rash all over body  . Dizziness and weakness   Immunizations Administered    Name Date Dose VIS Date Route   Pfizer COVID-19 Vaccine 08/26/2020 12:58 PM 0.3 mL 01/31/2019 Intramuscular   Manufacturer: [place]   Lot: [id number]   NDC: [id number]

## 2020-08-30 MED FILL — PFIZER-BIONTECH COVID-19 VA: 30 | 1 days supply | Qty: 0 | Fill #0

## 2020-10-22 DIAGNOSIS — Z01419 Encounter for gynecological examination (general) (routine) without abnormal findings: Secondary | ICD-10-CM | POA: Diagnosis not present

## 2020-10-22 DIAGNOSIS — Z6832 Body mass index (BMI) 32.0-32.9, adult: Secondary | ICD-10-CM | POA: Diagnosis not present

## 2021-03-01 NOTE — Patient Instructions (Addendum)
It was great to see you again today, I will be in touch with your labs as soon as possible We will get you set up for a screening colonoscopy Do covid booster at your convenience  Take care!     Health Maintenance, Female Adopting a healthy lifestyle and getting preventive care are important in promoting health and wellness. Ask your health care provider about:  The right schedule for you to have regular tests and exams.  Things you can do on your own to prevent diseases and keep yourself healthy. What should I know about diet, weight, and exercise? Eat a healthy diet  Eat a diet that includes plenty of vegetables, fruits, low-fat dairy products, and lean protein.  Do not eat a lot of foods that are high in solid fats, added sugars, or sodium.   Maintain a healthy weight Body mass index (BMI) is used to identify weight problems. It estimates body fat based on height and weight. Your health care provider can help determine your BMI and help you achieve or maintain a healthy weight. Get regular exercise Get regular exercise. This is one of the most important things you can do for your health. Most adults should:  Exercise for at least 150 minutes each week. The exercise should increase your heart rate and make you sweat (moderate-intensity exercise).  Do strengthening exercises at least twice a week. This is in addition to the moderate-intensity exercise.  Spend less time sitting. Even light physical activity can be beneficial. Watch cholesterol and blood lipids Have your blood tested for lipids and cholesterol at 47 years of age, then have this test every 5 years. Have your cholesterol levels checked more often if:  Your lipid or cholesterol levels are high.  You are older than 47 years of age.  You are at high risk for heart disease. What should I know about cancer screening? Depending on your health history and family history, you may need to have cancer screening at various  ages. This may include screening for:  Breast cancer.  Cervical cancer.  Colorectal cancer.  Skin cancer.  Lung cancer. What should I know about heart disease, diabetes, and high blood pressure? Blood pressure and heart disease  High blood pressure causes heart disease and increases the risk of stroke. This is more likely to develop in people who have high blood pressure readings, are of African descent, or are overweight.  Have your blood pressure checked: ? Every 3-5 years if you are 39-35 years of age. ? Every year if you are 5 years old or older. Diabetes Have regular diabetes screenings. This checks your fasting blood sugar level. Have the screening done:  Once every three years after age 53 if you are at a normal weight and have a low risk for diabetes.  More often and at a younger age if you are overweight or have a high risk for diabetes. What should I know about preventing infection? Hepatitis B If you have a higher risk for hepatitis B, you should be screened for this virus. Talk with your health care provider to find out if you are at risk for hepatitis B infection. Hepatitis C Testing is recommended for:  Everyone born from [birth year] through 1967.  Anyone with known risk factors for hepatitis C. Sexually transmitted infections (STIs)  Get screened for STIs, including gonorrhea and chlamydia, if: ? You are sexually active and are younger than 47 years of age. ? You are older than 47 years of age  and your health care provider tells you that you are at risk for this type of infection. ? Your sexual activity has changed since you were last screened, and you are at increased risk for chlamydia or gonorrhea. Ask your health care provider if you are at risk.  Ask your health care provider about whether you are at high risk for HIV. Your health care provider may recommend a prescription medicine to help prevent HIV infection. If you choose to take medicine to prevent HIV, you  should first get tested for HIV. You should then be tested every 3 months for as long as you are taking the medicine. Pregnancy  If you are about to stop having your period (premenopausal) and you may become pregnant, seek counseling before you get pregnant.  Take 400 to 800 micrograms (mcg) of folic acid every day if you become pregnant.  Ask for birth control (contraception) if you want to prevent pregnancy. Osteoporosis and menopause Osteoporosis is a disease in which the bones lose minerals and strength with aging. This can result in bone fractures. If you are 64 years old or older, or if you are at risk for osteoporosis and fractures, ask your health care provider if you should:  Be screened for bone loss.  Take a calcium or vitamin D supplement to lower your risk of fractures.  Be given hormone replacement therapy (HRT) to treat symptoms of menopause. Follow these instructions at home: Lifestyle  Do not use any products that contain nicotine or tobacco, such as cigarettes, e-cigarettes, and chewing tobacco. If you need help quitting, ask your health care provider.  Do not use street drugs.  Do not share needles.  Ask your health care provider for help if you need support or information about quitting drugs. Alcohol use  Do not drink alcohol if: ? Your health care provider tells you not to drink. ? You are pregnant, may be pregnant, or are planning to become pregnant.  If you drink alcohol: ? Limit how much you use to 0-1 drink a day. ? Limit intake if you are breastfeeding.  Be aware of how much alcohol is in your drink. In the [place] one drink equals one 12 oz bottle of beer (355 mL), one 5 oz glass of wine (148 mL), or one 1 oz glass of hard liquor (44 mL). General instructions  Schedule regular health, dental, and eye exams.  Stay current with your vaccines.  Tell your health care provider if: ? You often feel depressed. ? You have ever been abused or do not feel  safe at home. Summary  Adopting a healthy lifestyle and getting preventive care are important in promoting health and wellness.  Follow your health care provider's instructions about healthy diet, exercising, and getting tested or screened for diseases.  Follow your health care provider's instructions on monitoring your cholesterol and blood pressure. This information is not intended to replace advice given to you by your health care provider. Make sure you discuss any questions you have with your health care provider. Document Revised: 11/16/2018 Document Reviewed: 11/16/2018 Elsevier Patient Education  2021 [employer].

## 2021-03-01 NOTE — Progress Notes (Addendum)
[place] at [facility] [address], [place], [place] [address] [phone]  Date:  03/03/2021   Name:  Kayla [name]   DOB:  [birth year]   MRN:  [id number]  PCP:  Darreld Mclean, MD    Chief Complaint: Annual Exam   History of Present Illness:  Kayla [name] is a 45 y.o. very pleasant female patient who presents with the following:  Patient here today for physical exam Last seen by myself 2 years ago  She is generally healthy, she does have family history of breast and ovarian cancer and has undergone genetic counseling. She had no increased risk factors   Married to [place], she has 2 sons currently ages 42 and 34.  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 Ronita Hipps, she does have uterine fibroids  Hepatitis C screening- today  Pap smear- per GYN  Colon cancer screening-we discussed. She does have a 1st cousin with history of colon cancer and death from same in his 55s.  Given multiple cancers in her family we decided to move ahead with colonoscopy for screening Flu vaccine- offered, she declines today  COVID-19 booster- she will do this soon  Mammogram up-to-date Can offer labs today  She is walking for exercise- she takes her dog out for exercise   She notes some possible wax in her right ear She does use headphones at her job   She walks 3-4 miles several times a week No smoking No excess etoh   Patient Active Problem List   Diagnosis Date Noted  . Genetic testing 04/22/2018  . Family history of breast cancer   . Family history of ovarian cancer     Past Medical History:  Diagnosis Date  . Chicken pox   . Family history of breast cancer   . Family history of ovarian cancer   . Genetic testing 04/22/2018   Multi-Cancer panel (83 genes) @ [facility] - No pathogenic mutations detected    Past Surgical History:  Procedure Laterality Date  . CESAREAN SECTION  02/25/2003   . CESAREAN SECTION  02/24/05  . DILATION AND CURETTAGE OF UTERUS  2001/02/24  . GALLBLADDER SURGERY  24-Feb-2010  . WISDOM TOOTH EXTRACTION  2001-02-24    Social History   Tobacco Use  . Smoking status: Never Smoker  . Smokeless tobacco: Never Used  Substance Use Topics  . Alcohol use: Yes    Comment: occasional   . Drug use: No    Family History  Problem Relation Age of Onset  . Breast cancer Mother 38       2nd primary at 24; currently 62  . Hypertension Father   . Diabetes Maternal Grandmother   . Colon cancer Cousin        dx 73s; deceased 54; son of pat aunt with colon ca  . Stroke Paternal Aunt   . Colon cancer Paternal Aunt        dx 42s; currently 2  . Stroke Paternal Aunt   . Breast cancer Paternal Aunt         dx 32s; currently 53  . Ovarian cancer Maternal Aunt 24-Feb-2042       deceased 45    No Known Allergies  Medication list has been reviewed and updated.  Current Outpatient Medications on File Prior to Visit  Medication Sig Dispense Refill  . Calcium Carbonate-Vitamin D (CALCIUM-VITAMIN D3 PO) Take  1 tablet by mouth daily.    . Multiple Vitamin (MULTIVITAMIN) tablet Take 1 tablet by mouth daily.     No current facility-administered medications on file prior to visit.    Review of Systems:  As per HPI- otherwise negative.   Physical Examination: Vitals:   03/03/21 1356  BP: 126/72  Pulse: 66  Resp: 16  Temp: 97.8 F (36.6 C)  SpO2: 97%   Vitals:   03/03/21 1356  Weight: 197 lb (89.4 kg)  Height: 5\' 4"  (1.626 m)   Body mass index is 33.81 kg/m. Ideal Body Weight: Weight in (lb) to have BMI = 25: 145.3  GEN: no acute distress.  Obese, looks well  HEENT: Atraumatic, Normocephalic.  Ears and Nose: No external deformity. CV: RRR, No M/G/R. No JVD. No thrill. No extra heart sounds. PULM: CTA B, no wheezes, crackles, rhonchi. No retractions. No resp. distress. No accessory muscle use. ABD: S, NT, ND, +BS. No rebound. No HSM. EXTR: No c/c/e PSYCH: Normally  interactive. Conversant.   She suspects earwax, would like this to be removed Cerumen impaction bilaterally -this is removed through irrigation bilaterally.  No complications, patient tolerated procedure well  Assessment and Plan: Physical exam  Screening for deficiency anemia - Plan: CBC  Screening for diabetes mellitus - Plan: Comprehensive metabolic panel, Hemoglobin A1c  Encounter for hepatitis C screening test for low risk patient - Plan: Hepatitis C antibody  Screening for hyperlipidemia - Plan: Lipid panel  Screening for thyroid disorder - Plan: TSH  Fatigue, unspecified type - Plan: TSH, VITAMIN D 25 Hydroxy (Vit-D Deficiency, Fractures)  Screening for colon cancer - Plan: Ambulatory referral to Gastroenterology  Patient today for a physical exam.  Encouraged continued diet and routine exercise. Will plan further follow- up pending labs. Cerumen impaction resolved as above Referral to GI for screening colonoscopy Will plan further follow- up pending labs.  This visit occurred during the SARS-CoV-2 public health emergency.  Safety protocols were in place, including screening questions prior to the visit, additional usage of staff PPE, and extensive cleaning of exam room while observing appropriate contact time as indicated for disinfecting solutions.    Signed Lamar Blinks, MD   Addendum 3/29, received her labs as below.  Message to patient. Results for orders placed or performed in visit on 03/03/21  CBC  Result Value Ref Range   WBC 8.5 4.0 - 10.5 K/uL   RBC 4.32 3.87 - 5.11 Mil/uL   Platelets 339.0 150.0 - 400.0 K/uL   Hemoglobin 13.3 12.0 - 15.0 g/dL   HCT 40.4 36.0 - 46.0 %   MCV 93.6 78.0 - 100.0 fl   MCHC 33.0 30.0 - 36.0 g/dL   RDW 14.5 11.5 - 15.5 %  Comprehensive metabolic panel  Result Value Ref Range   Sodium 137 135 - 145 mEq/L   Potassium 4.3 3.5 - 5.1 mEq/L   Chloride 101 96 - 112 mEq/L   CO2 29 19 - 32 mEq/L   Glucose, Bld 86 70 - 99  mg/dL   BUN 15 6 - 23 mg/dL   Creatinine, Ser 0.78 0.40 - 1.20 mg/dL   Total Bilirubin 0.4 0.2 - 1.2 mg/dL   Alkaline Phosphatase 86 39 - 117 U/L   AST 15 0 - 37 U/L   ALT 14 0 - 35 U/L   Total Protein 7.1 6.0 - 8.3 g/dL   Albumin 4.2 3.5 - 5.2 g/dL   GFR 90.71 >60.00 mL/min   Calcium 9.4 8.4 -  10.5 mg/dL  Hemoglobin A1c  Result Value Ref Range   Hgb A1c MFr Bld 5.6 4.6 - 6.5 %  Hepatitis C antibody  Result Value Ref Range   Hepatitis C Ab NON-REACTIVE NON-REACTI   SIGNAL TO CUT-OFF 0.01 <1.00  Lipid panel  Result Value Ref Range   Cholesterol 215 (H) 0 - 200 mg/dL   Triglycerides 154.0 (H) 0.0 - 149.0 mg/dL   HDL 51.20 >39.00 mg/dL   VLDL 30.8 0.0 - 40.0 mg/dL   LDL Cholesterol 133 (H) 0 - 99 mg/dL   Total CHOL/HDL Ratio 4    NonHDL 163.33   TSH  Result Value Ref Range   TSH 1.85 0.35 - 4.50 uIU/mL  VITAMIN D 25 Hydroxy (Vit-D Deficiency, Fractures)  Result Value Ref Range   VITD 23.08 (L) 30.00 - 100.00 ng/mL

## 2021-03-03 ENCOUNTER — Encounter: Payer: Self-pay | Admitting: Family Medicine

## 2021-03-03 ENCOUNTER — Ambulatory Visit (INDEPENDENT_AMBULATORY_CARE_PROVIDER_SITE_OTHER): Payer: BC Managed Care – PPO | Admitting: Family Medicine

## 2021-03-03 ENCOUNTER — Other Ambulatory Visit: Payer: Self-pay

## 2021-03-03 VITALS — BP 126/72 | HR 66 | Temp 97.8°F | Resp 16 | Ht 64.0 in | Wt 197.0 lb

## 2021-03-03 DIAGNOSIS — Z13220 Encounter for screening for lipoid disorders: Secondary | ICD-10-CM

## 2021-03-03 DIAGNOSIS — Z131 Encounter for screening for diabetes mellitus: Secondary | ICD-10-CM

## 2021-03-03 DIAGNOSIS — Z0000 Encounter for general adult medical examination without abnormal findings: Secondary | ICD-10-CM | POA: Diagnosis not present

## 2021-03-03 DIAGNOSIS — Z1159 Encounter for screening for other viral diseases: Secondary | ICD-10-CM | POA: Diagnosis not present

## 2021-03-03 DIAGNOSIS — Z1329 Encounter for screening for other suspected endocrine disorder: Secondary | ICD-10-CM

## 2021-03-03 DIAGNOSIS — R5383 Other fatigue: Secondary | ICD-10-CM | POA: Diagnosis not present

## 2021-03-03 DIAGNOSIS — Z130 Encounter for screening for diseases of the blood and blood-forming organs and certain disorders involving the immune mechanism: Secondary | ICD-10-CM

## 2021-03-03 DIAGNOSIS — Z1211 Encounter for screening for malignant neoplasm of colon: Secondary | ICD-10-CM

## 2021-03-04 ENCOUNTER — Encounter: Payer: Self-pay | Admitting: Family Medicine

## 2021-03-04 LAB — HEPATITIS C ANTIBODY
Hepatitis C Ab: NONREACTIVE
SIGNAL TO CUT-OFF: 0.01 (ref <1.00)

## 2021-03-04 LAB — LIPID PANEL
Cholesterol: 215 mg/dL — ABNORMAL HIGH (ref 0–200)
HDL: 51.2 mg/dL (ref >39.00)
LDL Cholesterol: 133 mg/dL — ABNORMAL HIGH (ref 0–99)
NonHDL: 163.33
Total CHOL/HDL Ratio: 4
Triglycerides: 154 mg/dL — ABNORMAL HIGH (ref 0.0–149.0)
VLDL: 30.8 mg/dL (ref 0.0–40.0)

## 2021-03-04 LAB — CBC
HCT: 40.4 % (ref 36.0–46.0)
Hemoglobin: 13.3 g/dL (ref 12.0–15.0)
MCHC: 33 g/dL (ref 30.0–36.0)
MCV: 93.6 fl (ref 78.0–100.0)
Platelets: 339 10*3/uL (ref 150.0–400.0)
RBC: 4.32 Mil/uL (ref 3.87–5.11)
RDW: 14.5 % (ref 11.5–15.5)
WBC: 8.5 10*3/uL (ref 4.0–10.5)

## 2021-03-04 LAB — COMPREHENSIVE METABOLIC PANEL
ALT: 14 U/L (ref 0–35)
AST: 15 U/L (ref 0–37)
Albumin: 4.2 g/dL (ref 3.5–5.2)
Alkaline Phosphatase: 86 U/L (ref 39–117)
BUN: 15 mg/dL (ref 6–23)
CO2: 29 mEq/L (ref 19–32)
Calcium: 9.4 mg/dL (ref 8.4–10.5)
Chloride: 101 mEq/L (ref 96–112)
Creatinine, Ser: 0.78 mg/dL (ref 0.40–1.20)
GFR: 90.71 mL/min (ref >60.00)
Glucose, Bld: 86 mg/dL (ref 70–99)
Potassium: 4.3 mEq/L (ref 3.5–5.1)
Sodium: 137 mEq/L (ref 135–145)
Total Bilirubin: 0.4 mg/dL (ref 0.2–1.2)
Total Protein: 7.1 g/dL (ref 6.0–8.3)

## 2021-03-04 LAB — TSH: TSH: 1.85 u[IU]/mL (ref 0.35–4.50)

## 2021-03-04 LAB — HEMOGLOBIN A1C: Hgb A1c MFr Bld: 5.6 % (ref 4.6–6.5)

## 2021-03-04 LAB — VITAMIN D 25 HYDROXY (VIT D DEFICIENCY, FRACTURES): VITD: 23.08 ng/mL — ABNORMAL LOW (ref 30.00–100.00)

## 2021-03-10 ENCOUNTER — Encounter: Payer: Self-pay | Admitting: Gastroenterology

## 2021-03-26 DIAGNOSIS — N926 Irregular menstruation, unspecified: Secondary | ICD-10-CM | POA: Diagnosis not present

## 2021-04-03 DIAGNOSIS — N926 Irregular menstruation, unspecified: Secondary | ICD-10-CM | POA: Diagnosis not present

## 2021-04-18 ENCOUNTER — Ambulatory Visit (AMBULATORY_SURGERY_CENTER): Payer: BC Managed Care – PPO

## 2021-04-18 ENCOUNTER — Other Ambulatory Visit: Payer: Self-pay

## 2021-04-18 VITALS — Ht 64.0 in | Wt 195.0 lb

## 2021-04-18 DIAGNOSIS — Z1211 Encounter for screening for malignant neoplasm of colon: Secondary | ICD-10-CM

## 2021-04-18 MED ORDER — CLENPIQ 10-3.5-12 MG-GM -GM/160ML PO SOLN: 1.0000 | ORAL | 0 refills | Status: DC

## 2021-04-18 NOTE — Progress Notes (Signed)
Pre visit completed via phone call; Patient verified name, DOB, and address; No egg or soy allergy known to patient  No issues with past sedation with any surgeries or procedures Patient denies ever being told they had issues or difficulty with intubation  No FH of Malignant Hyperthermia No diet pills per patient No home 02 use per patient  No blood thinners per patient  Pt denies issues with constipation  No A fib or A flutter  EMMI video via MyChart  COVID 19 guidelines implemented in PV today with Pt and RN  Pt is fully vaccinated for Covid x 2 NO PA's for preps discussed with pt in PV today, coupon sent in packet to patient  Discussed with pt there will be an out-of-pocket cost for prep and that varies from $0 to 70 dollars  Due to the COVID-19 pandemic we are asking patients to follow certain guidelines.  Pt aware of COVID protocols and [facility] guidelines

## 2021-04-25 ENCOUNTER — Encounter: Payer: Self-pay | Admitting: Gastroenterology

## 2021-04-30 ENCOUNTER — Ambulatory Visit (AMBULATORY_SURGERY_CENTER): Payer: BC Managed Care – PPO | Admitting: Gastroenterology

## 2021-04-30 ENCOUNTER — Other Ambulatory Visit: Payer: Self-pay

## 2021-04-30 ENCOUNTER — Encounter: Payer: Self-pay | Admitting: Gastroenterology

## 2021-04-30 VITALS — BP 122/62 | HR 61 | Temp 98.0°F | Resp 15 | Ht 64.0 in | Wt 195.0 lb

## 2021-04-30 DIAGNOSIS — Z1211 Encounter for screening for malignant neoplasm of colon: Secondary | ICD-10-CM | POA: Diagnosis not present

## 2021-04-30 DIAGNOSIS — D120 Benign neoplasm of cecum: Secondary | ICD-10-CM | POA: Diagnosis not present

## 2021-04-30 DIAGNOSIS — D122 Benign neoplasm of ascending colon: Secondary | ICD-10-CM | POA: Diagnosis not present

## 2021-04-30 DIAGNOSIS — Z800 Family history of malignant neoplasm of digestive organs: Secondary | ICD-10-CM | POA: Diagnosis not present

## 2021-04-30 NOTE — Progress Notes (Signed)
pt tolerated well. VSS. awake and to recovery. Report given to RN.  

## 2021-04-30 NOTE — Op Note (Signed)
[facility] Patient Name: Kayla [name] Procedure Date: 04/30/2021 1:24 PM MRN: [id number] Endoscopist: Jackquline Denmark , MD Age: 45 Referring MD:  Date of Birth: 09/14/1976 Gender: Female Account #: [id number] Procedure:                Colonoscopy Indications:              Screening for colorectal malignant neoplasm. FH                            polyps (dad) Medicines:                Monitored Anesthesia Care Procedure:                Pre-Anesthesia Assessment:                           - Prior to the procedure, a History and Physical                            was performed, and patient medications and                            allergies were reviewed. The patient's tolerance of                            previous anesthesia was also reviewed. The risks                            and benefits of the procedure and the sedation                            options and risks were discussed with the patient.                            All questions were answered, and informed consent                            was obtained. Prior Anticoagulants: The patient has                            taken no previous anticoagulant or antiplatelet                            agents. ASA Grade Assessment: I - A normal, healthy                            patient. After reviewing the risks and benefits,                            the patient was deemed in satisfactory condition to                            undergo the procedure.  After obtaining informed consent, the colonoscope                            was passed under direct vision. Throughout the                            procedure, the patient's blood pressure, pulse, and                            oxygen saturations were monitored continuously. The                            Olympus PCF-H190DL ([id number]) Colonoscope was                            introduced through the anus and advanced to the 2                             cm into the ileum. The colonoscopy was performed                            without difficulty. The patient tolerated the                            procedure well. The quality of the bowel                            preparation was good. The terminal ileum, ileocecal                            valve, appendiceal orifice, and rectum were                            photographed. Scope In: 1:31:14 PM Scope Out: 1:46:09 PM Scope Withdrawal Time: 0 hours 9 minutes 13 seconds  Total Procedure Duration: 0 hours 14 minutes 55 seconds  Findings:                 A 8 mm polyp was found in the proximal ascending                            colon. The polyp was sessile. The polyp was removed                            with a cold snare. Resection and retrieval were                            complete.                           A 2 mm polyp was found in the cecum. The polyp was                            sessile. The polyp was removed with a cold  biopsy                            forceps. Resection and retrieval were complete.                           Multiple medium-mouthed diverticula were found in                            the sigmoid colon.                           Non-bleeding internal hemorrhoids were found during                            retroflexion. The hemorrhoids were small.                           The terminal ileum appeared normal.                           The exam was otherwise without abnormality on                            direct and retroflexion views. Complications:            No immediate complications. Estimated Blood Loss:     Estimated blood loss: none. Impression:               - One 8 mm polyp in the proximal ascending colon,                            removed with a cold snare. Resected and retrieved.                           - One 2 mm polyp in the cecum, removed with a cold                            biopsy forceps. Resected and retrieved.                            - Moderate sigmoid diverticulosis.                           - Non-bleeding internal hemorrhoids.                           - The examined portion of the ileum was normal.                           - The examination was otherwise normal on direct                            and retroflexion views. Recommendation:           - Patient has a contact number available for  emergencies. The signs and symptoms of potential                            delayed complications were discussed with the                            patient. Return to normal activities tomorrow.                            Written discharge instructions were provided to the                            patient.                           - High fiber diet.                           - Continue present medications.                           - Await pathology results.                           - Repeat colonoscopy for surveillance based on                            pathology results.                           - The findings and recommendations were discussed                            with the patient's family. Jackquline Denmark, MD 04/30/2021 2:00:04 PM This report has been signed electronically.

## 2021-04-30 NOTE — Progress Notes (Signed)
Called to room to assist during endoscopic procedure.  Patient ID and intended procedure confirmed with present staff. Received instructions for my participation in the procedure from the performing physician.  

## 2021-04-30 NOTE — Progress Notes (Signed)
VS by CW  I have reviewed the patient's medical history in detail and updated the computerized patient record.  

## 2021-04-30 NOTE — Patient Instructions (Signed)
YOU HAD AN ENDOSCOPIC PROCEDURE TODAY AT THE [facility]:   Refer to the procedure report that was given to you for any specific questions about what was found during the examination.  If the procedure report does not answer your questions, please call your gastroenterologist to clarify.  If you requested that your care partner not be given the details of your procedure findings, then the procedure report has been included in a sealed envelope for you to review at your convenience later.  YOU SHOULD EXPECT: Some feelings of bloating in the abdomen. Passage of more gas than usual.  Walking can help get rid of the air that was put into your GI tract during the procedure and reduce the bloating. If you had a lower endoscopy (such as a colonoscopy or flexible sigmoidoscopy) you may notice spotting of blood in your stool or on the toilet paper. If you underwent a bowel prep for your procedure, you may not have a normal bowel movement for a few days.  Please Note:  You might notice some irritation and congestion in your nose or some drainage.  This is from the oxygen used during your procedure.  There is no need for concern and it should clear up in a day or so.  SYMPTOMS TO REPORT IMMEDIATELY:   Following lower endoscopy (colonoscopy or flexible sigmoidoscopy):  Excessive amounts of blood in the stool  Significant tenderness or worsening of abdominal pains  Swelling of the abdomen that is new, acute  Fever of 100F or higher   Following upper endoscopy (EGD)  Vomiting of blood or coffee ground material  New chest pain or pain under the shoulder blades  Painful or persistently difficult swallowing  New shortness of breath  Fever of 100F or higher  Black, tarry-looking stools  For urgent or emergent issues, a gastroenterologist can be reached at any hour by calling [phone]. Do not use MyChart messaging for urgent concerns.    DIET:  We do recommend a small meal at first, but  then you may proceed to your regular diet.  Drink plenty of fluids but you should avoid alcoholic beverages for 24 hours.  ACTIVITY:  You should plan to take it easy for the rest of today and you should NOT DRIVE or use heavy machinery until tomorrow (because of the sedation medicines used during the test).    FOLLOW UP: Our staff will call the number listed on your records 48-72 hours following your procedure to check on you and address any questions or concerns that you may have regarding the information given to you following your procedure. If we do not reach you, we will leave a message.  We will attempt to reach you two times.  During this call, we will ask if you have developed any symptoms of COVID 19. If you develop any symptoms (ie: fever, flu-like symptoms, shortness of breath, cough etc.) before then, please call [phone].  If you test positive for Covid 19 in the 2 weeks post procedure, please call and report this information to us.    If any biopsies were taken you will be contacted by phone or by letter within the next 1-3 weeks.  Please call us at [phone] if you have not heard about the biopsies in 3 weeks.    SIGNATURES/CONFIDENTIALITY: You and/or your care partner have signed paperwork which will be entered into your electronic medical record.  These signatures attest to the fact that that the information above on   your After Visit Summary has been reviewed and is understood.  Full responsibility of the confidentiality of this discharge information lies with you and/or your care-partner. 

## 2021-05-02 ENCOUNTER — Telehealth: Payer: Self-pay

## 2021-05-02 NOTE — Telephone Encounter (Signed)
LVM

## 2021-05-07 ENCOUNTER — Encounter: Payer: Self-pay | Admitting: Gastroenterology

## 2021-08-07 DIAGNOSIS — Z1231 Encounter for screening mammogram for malignant neoplasm of breast: Secondary | ICD-10-CM | POA: Diagnosis not present

## 2022-03-04 ENCOUNTER — Encounter: Payer: Self-pay | Admitting: *Deleted

## 2022-08-29 NOTE — Progress Notes (Unsigned)
[place] at [facility] [address] [place], [place] [address] [phone]  Date:  08/31/2022   Name:  Kayla [name]   DOB:  05/21/1976   MRN:  [id number]  PCP:  Darreld Mclean, MD    Chief Complaint: No chief complaint on file.   History of Present Illness:  Kayla [name] is a 46 y.o. very pleasant female patient who presents with the following:  Pt seen today for a CPE Last seen by myself one year ago  She is generally healthy, she does have family history of breast and ovarian cancer and has undergone genetic counseling. She had no increased risk factors    Married to [place], she has 2 sons currently ages 89 and 66.  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  Labs one year ago  Flu shot Covid booster     Patient Active Problem List   Diagnosis Date Noted   Genetic testing 04/22/2018   Family history of breast cancer    Family history of ovarian cancer     Past Medical History:  Diagnosis Date   Chicken pox    Family history of breast cancer    Family history of ovarian cancer    Genetic testing 04/22/2018   Multi-Cancer panel (03-16-1982 genes) @ [facility] - No pathogenic mutations detected   Vitamin D deficiency    currently on meds    Past Surgical History:  Procedure Laterality Date   CESAREAN SECTION  2003/03/17   CHOLECYSTECTOMY  16-Mar-2010   DILATION AND CURETTAGE OF UTERUS  03/16/2001   WISDOM TOOTH EXTRACTION  03/16/2001    Social History   Tobacco Use   Smoking status: Never   Smokeless tobacco: Never  Vaping Use   Vaping Use: Never used  Substance Use Topics   Alcohol use: Yes    Comment: once per month   Drug use: No    Family History  Problem Relation Age of Onset   Breast cancer Mother 53       2nd primary at 74; currently 25   Hypertension Father    Colon polyps Father 34   Diabetes Maternal Grandmother    Colon cancer Cousin        dx 41s;  deceased 48; son of pat aunt with colon ca   Stroke Paternal Aunt    Colon cancer Paternal Aunt        dx 61s; currently 19   Stroke Paternal Aunt    Breast cancer Paternal Aunt         dx 47s; currently 68   Ovarian cancer Maternal Aunt 03-16-42       deceased 54   Esophageal cancer Neg Hx    Stomach cancer Neg Hx    Rectal cancer Neg Hx     No Known Allergies  Medication list has been reviewed and updated.  Current Outpatient Medications on File Prior to Visit  Medication Sig Dispense Refill   Calcium Carbonate-Vitamin D (CALCIUM-VITAMIN D3 PO) Take 1 tablet by mouth daily.     Multiple Vitamin (MULTIVITAMIN) tablet Take 2 tablets by mouth daily.     VITAMIN D PO Take 1 tablet by mouth daily at 6 (six) AM.     No current facility-administered medications on file prior to visit.    Review of Systems:  As per HPI- otherwise negative.   Physical  Examination: There were no vitals filed for this visit. There were no vitals filed for this visit. There is no height or weight on file to calculate BMI. Ideal Body Weight:    GEN: no acute distress. HEENT: Atraumatic, Normocephalic.  Ears and Nose: No external deformity. CV: RRR, No M/G/R. No JVD. No thrill. No extra heart sounds. PULM: CTA B, no wheezes, crackles, rhonchi. No retractions. No resp. distress. No accessory muscle use. ABD: S, NT, ND, +BS. No rebound. No HSM. EXTR: No c/c/e PSYCH: Normally interactive. Conversant.    Assessment and Plan: ***  Physical exam- encouraged healthy diet and exercise routine Will plan further follow- up pending labs.  Signed Lamar Blinks, MD

## 2022-08-29 NOTE — Patient Instructions (Signed)
Good to see you today- I will be in touch with your labs asap!    Please see me in one year assuming all is well Recommend continued walking for exercise, perhaps add in some weight training as well Keep an eye on weight- it does tend to creep up with menopausal hormone changes  Have a great rest of the year!

## 2022-08-31 ENCOUNTER — Ambulatory Visit (INDEPENDENT_AMBULATORY_CARE_PROVIDER_SITE_OTHER): Payer: BC Managed Care – PPO | Admitting: Family Medicine

## 2022-08-31 VITALS — BP 118/80 | HR 64 | Temp 97.6°F | Resp 18 | Ht 64.0 in | Wt 200.4 lb

## 2022-08-31 DIAGNOSIS — Z1329 Encounter for screening for other suspected endocrine disorder: Secondary | ICD-10-CM | POA: Diagnosis not present

## 2022-08-31 DIAGNOSIS — Z130 Encounter for screening for diseases of the blood and blood-forming organs and certain disorders involving the immune mechanism: Secondary | ICD-10-CM

## 2022-08-31 DIAGNOSIS — Z13220 Encounter for screening for lipoid disorders: Secondary | ICD-10-CM | POA: Diagnosis not present

## 2022-08-31 DIAGNOSIS — R5383 Other fatigue: Secondary | ICD-10-CM | POA: Diagnosis not present

## 2022-08-31 DIAGNOSIS — Z0000 Encounter for general adult medical examination without abnormal findings: Secondary | ICD-10-CM | POA: Diagnosis not present

## 2022-08-31 DIAGNOSIS — Z131 Encounter for screening for diabetes mellitus: Secondary | ICD-10-CM

## 2022-08-31 LAB — COMPREHENSIVE METABOLIC PANEL
ALT: 17 U/L (ref 0–35)
AST: 18 U/L (ref 0–37)
Albumin: 4.3 g/dL (ref 3.5–5.2)
Alkaline Phosphatase: 100 U/L (ref 39–117)
BUN: 16 mg/dL (ref 6–23)
CO2: 25 mEq/L (ref 19–32)
Calcium: 9.6 mg/dL (ref 8.4–10.5)
Chloride: 103 mEq/L (ref 96–112)
Creatinine, Ser: 0.86 mg/dL (ref 0.40–1.20)
GFR: 79.84 mL/min (ref >60.00)
Glucose, Bld: 104 mg/dL — ABNORMAL HIGH (ref 70–99)
Potassium: 4.4 mEq/L (ref 3.5–5.1)
Sodium: 139 mEq/L (ref 135–145)
Total Bilirubin: 0.4 mg/dL (ref 0.2–1.2)
Total Protein: 7.2 g/dL (ref 6.0–8.3)

## 2022-08-31 LAB — CBC
HCT: 41.6 % (ref 36.0–46.0)
Hemoglobin: 14.2 g/dL (ref 12.0–15.0)
MCHC: 34.1 g/dL (ref 30.0–36.0)
MCV: 94.6 fl (ref 78.0–100.0)
Platelets: 299 10*3/uL (ref 150.0–400.0)
RBC: 4.4 Mil/uL (ref 3.87–5.11)
RDW: 13.5 % (ref 11.5–15.5)
WBC: 6.2 10*3/uL (ref 4.0–10.5)

## 2022-08-31 LAB — LIPID PANEL
Cholesterol: 203 mg/dL — ABNORMAL HIGH (ref 0–200)
HDL: 49.6 mg/dL (ref >39.00)
LDL Cholesterol: 130 mg/dL — ABNORMAL HIGH (ref 0–99)
NonHDL: 153.4
Total CHOL/HDL Ratio: 4
Triglycerides: 118 mg/dL (ref 0.0–149.0)
VLDL: 23.6 mg/dL (ref 0.0–40.0)

## 2022-08-31 LAB — VITAMIN D 25 HYDROXY (VIT D DEFICIENCY, FRACTURES): VITD: 47.69 ng/mL (ref 30.00–100.00)

## 2022-08-31 LAB — TSH: TSH: 2.19 u[IU]/mL (ref 0.35–5.50)

## 2022-09-01 ENCOUNTER — Encounter: Payer: Self-pay | Admitting: Family Medicine

## 2022-09-01 LAB — HEMOGLOBIN A1C: Hgb A1c MFr Bld: 5.8 % (ref 4.6–6.5)

## 2022-09-16 DIAGNOSIS — Z01411 Encounter for gynecological examination (general) (routine) with abnormal findings: Secondary | ICD-10-CM | POA: Diagnosis not present

## 2022-09-16 DIAGNOSIS — Z01419 Encounter for gynecological examination (general) (routine) without abnormal findings: Secondary | ICD-10-CM | POA: Diagnosis not present

## 2022-09-16 DIAGNOSIS — N914 Secondary oligomenorrhea: Secondary | ICD-10-CM | POA: Diagnosis not present

## 2022-09-16 DIAGNOSIS — Z124 Encounter for screening for malignant neoplasm of cervix: Secondary | ICD-10-CM | POA: Diagnosis not present

## 2022-09-16 DIAGNOSIS — Z6834 Body mass index (BMI) 34.0-34.9, adult: Secondary | ICD-10-CM | POA: Diagnosis not present

## 2022-09-16 DIAGNOSIS — Z1231 Encounter for screening mammogram for malignant neoplasm of breast: Secondary | ICD-10-CM | POA: Diagnosis not present

## 2023-06-22 DIAGNOSIS — N950 Postmenopausal bleeding: Secondary | ICD-10-CM | POA: Diagnosis not present

## 2023-10-27 DIAGNOSIS — N950 Postmenopausal bleeding: Secondary | ICD-10-CM | POA: Diagnosis not present

## 2023-11-24 DIAGNOSIS — Z01419 Encounter for gynecological examination (general) (routine) without abnormal findings: Secondary | ICD-10-CM | POA: Diagnosis not present

## 2023-11-24 DIAGNOSIS — Z124 Encounter for screening for malignant neoplasm of cervix: Secondary | ICD-10-CM | POA: Diagnosis not present

## 2023-11-24 DIAGNOSIS — Z1231 Encounter for screening mammogram for malignant neoplasm of breast: Secondary | ICD-10-CM | POA: Diagnosis not present

## 2023-11-24 DIAGNOSIS — Z1331 Encounter for screening for depression: Secondary | ICD-10-CM | POA: Diagnosis not present

## 2023-11-24 DIAGNOSIS — Z01411 Encounter for gynecological examination (general) (routine) with abnormal findings: Secondary | ICD-10-CM | POA: Diagnosis not present

## 2024-12-16 NOTE — Patient Instructions (Signed)
 It was good to see you today, I will be in touch with your labs

## 2024-12-16 NOTE — Progress Notes (Unsigned)
 [facility] [address] [place], [place] [address] [phone]  Date:  12/18/2024   Name:  Kayla [name]   DOB:  03/12/1976   MRN:  [id number]  PCP:  Watt Harlene BROCKS, MD    Chief Complaint: No chief complaint on file.   History of Present Illness:  Kayla [name] is a 49 y.o. very pleasant female patient who presents with the following:  Patient seen today for physical exam.  I saw her most recently in September 2023  She is married, has 2 young adult sons. Generally healthy with family history of breast and ovarian cancer-she had genetic counseling and was found do not have any particular risk factors  Flu vaccine Pneumonia vaccine Shingrix  She has gynecology care Colonoscopy done in 2022, 5-year recheck Can update labs today  Discussed the use of AI scribe software for clinical note transcription with the patient, who gave verbal consent to proceed.  History of Present Illness     Patient Active Problem List   Diagnosis Date Noted   Genetic testing 04/22/2018   Family history of breast cancer    Family history of ovarian cancer     Past Medical History:  Diagnosis Date   Chicken pox    Family history of breast cancer    Family history of ovarian cancer    Genetic testing 04/22/2018   Multi-Cancer panel (83 genes) @ [facility] - No pathogenic mutations detected   Vitamin D  deficiency    currently on meds    Past Surgical History:  Procedure Laterality Date   CESAREAN SECTION  2004,2006   CHOLECYSTECTOMY  2011   DILATION AND CURETTAGE OF UTERUS  2002   WISDOM TOOTH EXTRACTION  2002    Social History[1]  Family History  Problem Relation Age of Onset   Breast cancer Mother 46       2nd primary at 42; currently 32   Hypertension Father    Colon polyps Father 72   Diabetes Maternal Grandmother    Colon cancer Cousin        dx 28s; deceased 48; son of pat aunt with colon ca   Stroke  Paternal Aunt    Colon cancer Paternal Aunt        dx 67s; currently 41   Stroke Paternal Aunt    Breast cancer Paternal Aunt         dx 35s; currently 65   Ovarian cancer Maternal Aunt 2       deceased 35   Esophageal cancer Neg Hx    Stomach cancer Neg Hx    Rectal cancer Neg Hx     Allergies[2]  Medication list has been reviewed and updated.  Medications Ordered Prior to Encounter[3]  Review of Systems:  As per HPI- otherwise negative.   Physical Examination: There were no vitals filed for this visit. There were no vitals filed for this visit. There is no height or weight on file to calculate BMI. Ideal Body Weight:    GEN: no acute distress. HEENT: Atraumatic, Normocephalic.  Ears and Nose: No external deformity. CV: RRR, No M/G/R. No JVD. No thrill. No extra heart sounds. PULM: CTA B, no wheezes, crackles, rhonchi. No retractions. No resp. distress. No accessory muscle use. ABD: S, NT, ND, +BS. No rebound. No HSM. EXTR: No c/c/e PSYCH: Normally interactive. Conversant.    Assessment and Plan: No diagnosis found.  Assessment & Plan  Signed Harlene Schroeder, MD    [1]  Social History Tobacco Use   Smoking status: Never   Smokeless tobacco: Never  Vaping Use   Vaping status: Never Used  Substance Use Topics   Alcohol use: Yes    Comment: once per month   Drug use: No  [2] No Known Allergies [3]  Current Outpatient Medications on File Prior to Visit  Medication Sig Dispense Refill   Calcium Carbonate-Vitamin D  (CALCIUM-VITAMIN D3 PO) Take 1 tablet by mouth daily.     Multiple Vitamin (MULTIVITAMIN) tablet Take 2 tablets by mouth daily.     VITAMIN D  PO Take 1 tablet by mouth daily at 6 (six) AM.     No current facility-administered medications on file prior to visit.   "

## 2024-12-18 ENCOUNTER — Encounter: Payer: Self-pay | Admitting: Family Medicine

## 2024-12-18 ENCOUNTER — Ambulatory Visit: Admitting: Family Medicine

## 2024-12-18 VITALS — BP 114/78 | HR 64 | Temp 97.9°F | Ht 64.0 in | Wt 198.0 lb

## 2024-12-18 DIAGNOSIS — Z13220 Encounter for screening for lipoid disorders: Secondary | ICD-10-CM | POA: Diagnosis not present

## 2024-12-18 DIAGNOSIS — Z0000 Encounter for general adult medical examination without abnormal findings: Secondary | ICD-10-CM | POA: Diagnosis not present

## 2024-12-18 DIAGNOSIS — Z1329 Encounter for screening for other suspected endocrine disorder: Secondary | ICD-10-CM

## 2024-12-18 DIAGNOSIS — Z23 Encounter for immunization: Secondary | ICD-10-CM | POA: Diagnosis not present

## 2024-12-18 DIAGNOSIS — Z130 Encounter for screening for diseases of the blood and blood-forming organs and certain disorders involving the immune mechanism: Secondary | ICD-10-CM

## 2024-12-18 DIAGNOSIS — Z131 Encounter for screening for diabetes mellitus: Secondary | ICD-10-CM | POA: Diagnosis not present

## 2024-12-18 LAB — COMPREHENSIVE METABOLIC PANEL WITH GFR
ALT: 16 U/L (ref 3–35)
AST: 17 U/L (ref 5–37)
Albumin: 4.4 g/dL (ref 3.5–5.2)
Alkaline Phosphatase: 90 U/L (ref 39–117)
BUN: 16 mg/dL (ref 6–23)
CO2: 28 meq/L (ref 19–32)
Calcium: 9.7 mg/dL (ref 8.4–10.5)
Chloride: 104 meq/L (ref 96–112)
Creatinine, Ser: 0.95 mg/dL (ref 0.40–1.20)
GFR: 69.71 mL/min
Glucose, Bld: 103 mg/dL — ABNORMAL HIGH (ref 70–99)
Potassium: 4.4 meq/L (ref 3.5–5.1)
Sodium: 139 meq/L (ref 135–145)
Total Bilirubin: 0.4 mg/dL (ref 0.2–1.2)
Total Protein: 7.2 g/dL (ref 6.0–8.3)

## 2024-12-18 LAB — LIPID PANEL
Cholesterol: 198 mg/dL (ref 28–200)
HDL: 53.2 mg/dL
LDL Cholesterol: 125 mg/dL — ABNORMAL HIGH (ref 10–99)
NonHDL: 144.88
Total CHOL/HDL Ratio: 4
Triglycerides: 99 mg/dL (ref 10.0–149.0)
VLDL: 19.8 mg/dL (ref 0.0–40.0)

## 2024-12-18 LAB — CBC
HCT: 42.2 % (ref 36.0–46.0)
Hemoglobin: 14.3 g/dL (ref 12.0–15.0)
MCHC: 33.8 g/dL (ref 30.0–36.0)
MCV: 94.6 fl (ref 78.0–100.0)
Platelets: 300 K/uL (ref 150.0–400.0)
RBC: 4.46 Mil/uL (ref 3.87–5.11)
RDW: 13.4 % (ref 11.5–15.5)
WBC: 5.9 K/uL (ref 4.0–10.5)

## 2024-12-18 LAB — HEMOGLOBIN A1C: Hgb A1c MFr Bld: 5.8 % (ref 4.6–6.5)

## 2024-12-18 LAB — TSH: TSH: 2.19 u[IU]/mL (ref 0.35–5.50)

## 2024-12-22 LAB — HM MAMMOGRAPHY
